# Patient Record
Sex: Female | Born: 1950 | Race: White | Hispanic: No | Marital: Married
Health system: Southern US, Community
[De-identification: ages and names within clinical notes are randomized; demographics above are authoritative.]

## PROBLEM LIST (undated history)

## (undated) DIAGNOSIS — K760 Fatty (change of) liver, not elsewhere classified: Secondary | ICD-10-CM

## (undated) DIAGNOSIS — E119 Type 2 diabetes mellitus without complications: Secondary | ICD-10-CM

## (undated) DIAGNOSIS — Z780 Asymptomatic menopausal state: Secondary | ICD-10-CM

## (undated) DIAGNOSIS — I10 Essential (primary) hypertension: Secondary | ICD-10-CM

## (undated) DIAGNOSIS — K649 Unspecified hemorrhoids: Secondary | ICD-10-CM

## (undated) DIAGNOSIS — F419 Anxiety disorder, unspecified: Secondary | ICD-10-CM

## (undated) DIAGNOSIS — E785 Hyperlipidemia, unspecified: Secondary | ICD-10-CM

## (undated) DIAGNOSIS — R7302 Impaired glucose tolerance (oral): Secondary | ICD-10-CM

## (undated) HISTORY — PX: OTHER SURGICAL HISTORY: SHX169

## (undated) HISTORY — DX: Hyperlipidemia, unspecified: E78.5

## (undated) HISTORY — DX: Unspecified hemorrhoids: K64.9

## (undated) HISTORY — DX: Impaired glucose tolerance (oral): R73.02

## (undated) HISTORY — DX: Asymptomatic menopausal state: Z78.0

## (undated) HISTORY — PX: ABDOMINAL HYSTERECTOMY: SHX81

## (undated) HISTORY — PX: TUBAL LIGATION: SHX77

## (undated) HISTORY — DX: Anxiety disorder, unspecified: F41.9

## (undated) HISTORY — DX: Fatty (change of) liver, not elsewhere classified: K76.0

## (undated) HISTORY — PX: TONSILLECTOMY: SUR1361

---

## 2006-06-18 ENCOUNTER — Ambulatory Visit: Payer: Self-pay | Admitting: Gynecology

## 2007-04-21 ENCOUNTER — Ambulatory Visit: Payer: Self-pay | Admitting: Internal Medicine

## 2007-06-24 ENCOUNTER — Ambulatory Visit: Payer: Self-pay | Admitting: Gynecology

## 2007-07-08 ENCOUNTER — Ambulatory Visit: Payer: Self-pay | Admitting: Gynecology

## 2007-07-10 ENCOUNTER — Ambulatory Visit: Payer: Self-pay | Admitting: Gynecology

## 2008-06-12 ENCOUNTER — Ambulatory Visit: Payer: Self-pay | Admitting: Gastroenterology

## 2008-07-14 ENCOUNTER — Ambulatory Visit: Payer: Self-pay | Admitting: Family Medicine

## 2009-09-01 ENCOUNTER — Ambulatory Visit: Payer: Self-pay | Admitting: General Practice

## 2010-12-20 NOTE — Assessment & Plan Note (Signed)
NAMEKERA, DEACON NO.:  [id number]   MEDICAL RECORD NO.:  [id number]          PATIENT TYPE:  POB   LOCATION:  [facility] at [facility]         FACILITY:  [facility]   PHYSICIAN:  Allie Bossier, MD        DATE OF BIRTH:  02-26-1951   DATE OF SERVICE:  06/24/2007                                  CLINIC NOTE   IDENTIFICATION:  This is a 60 year old married white gravida 1, para 43  with a 84 year old daughter and two grandchildren who comes here for her  annual exam. She has no complaints today.  She wishes to have a refill  of her  0.65 mg Premarin daily.  She has tried to wean herself off and  has been uncomfortable with menopausal symptoms.  She wishes to  continue.   PAST MEDICAL HISTORY:  Obesity.   PAST SURGICAL HISTORY:  1. TVH.  2. Tonsillectomy.  3. Nissen fundoplication.  4. Ganglion cyst removal x3.   NO KNOWN DRUG ALLERGIES.  No latex allergies.   SOCIAL HISTORY:  Is negative for tobacco, alcohol, drug use.   FAMILY HISTORY:  Is negative for breast, GYN and colon malignancies.   REVIEW OF SYSTEMS:  Her last mammogram was in 2006 and was normal.  She  has never had a colonoscopy and is not inclined to do so she has been  married for the last 35 years.   MEDICATIONS:  1. Premarin 0.625 mg daily.  2. Vitamin C daily.  3. Calcium daily.   PHYSICAL EXAM:  Weight 194, blood pressure of 132/73, height 5 feet, 7  inches.  HEENT:  Normal.  HEART:  Regular rhythm.  LUNGS:  Clear to auscultation bilaterally.  ABDOMEN:  Benign.  Breast exam normal.  EXTERNAL GENITALIA:  Mild atrophy, otherwise normal. Cuff normal.  Bimanual exam negative for masses.   ASSESSMENT/PLAN:  1. Annual exam and I have recommended a colonoscopy as well as self-      breast and self vulvar exams.  2. With regards to her mild obesity,  I have recommended checking a      TSH and fasting lipid panel, fasting glucose today.      Allie Bossier, MD     MCD/MEDQ  D:  06/24/2007   T:  06/24/2007  Job:  [id number]

## 2011-08-30 ENCOUNTER — Ambulatory Visit: Payer: Self-pay | Admitting: Family Medicine

## 2012-03-26 ENCOUNTER — Ambulatory Visit: Payer: Self-pay | Admitting: Family Medicine

## 2013-04-01 ENCOUNTER — Ambulatory Visit: Payer: Self-pay | Admitting: Family Medicine

## 2016-04-14 ENCOUNTER — Other Ambulatory Visit: Payer: Self-pay | Admitting: Family Medicine

## 2016-04-14 DIAGNOSIS — R748 Abnormal levels of other serum enzymes: Secondary | ICD-10-CM

## 2016-04-24 ENCOUNTER — Ambulatory Visit
Admission: RE | Admit: 2016-04-24 | Discharge: 2016-04-24 | Disposition: A | Discharge status: Home / Self Care | Payer: PRIVATE HEALTH INSURANCE | Source: Ambulatory Visit | Attending: Family Medicine | Admitting: Family Medicine

## 2016-04-24 DIAGNOSIS — K760 Fatty (change of) liver, not elsewhere classified: Secondary | ICD-10-CM | POA: Diagnosis not present

## 2016-04-24 DIAGNOSIS — K8020 Calculus of gallbladder without cholecystitis without obstruction: Secondary | ICD-10-CM | POA: Diagnosis not present

## 2016-04-24 DIAGNOSIS — R748 Abnormal levels of other serum enzymes: Secondary | ICD-10-CM | POA: Diagnosis present

## 2019-10-05 ENCOUNTER — Ambulatory Visit: Payer: PRIVATE HEALTH INSURANCE | Attending: Internal Medicine

## 2019-10-05 DIAGNOSIS — Z23 Encounter for immunization: Secondary | ICD-10-CM

## 2019-10-05 NOTE — Progress Notes (Signed)
   Covid-19 Vaccination Clinic  Name:  TAELYNN MCELHANNON    MRN: [id number] DOB: 12-Mar-1951  10/05/2019  Ms. Alviar was observed post Covid-19 immunization for 15 minutes without incidence. She was provided with Vaccine Information Sheet and instruction to access the V-Safe system.   Ms. Schuitema was instructed to call 911 with any severe reactions post vaccine: Marland Kitchen Difficulty breathing  . Swelling of your face and throat  . A fast heartbeat  . A bad rash all over your body  . Dizziness and weakness    Immunizations Administered    Name Date Dose VIS Date Route   Pfizer COVID-19 Vaccine 10/05/2019  8:31 AM 0.3 mL 07/18/2019 Intramuscular   Manufacturer: [employer], [employer]   Lot: [id number]   NDC: [address]-5

## 2019-10-29 ENCOUNTER — Ambulatory Visit: Payer: PRIVATE HEALTH INSURANCE | Attending: Internal Medicine

## 2019-10-29 DIAGNOSIS — Z23 Encounter for immunization: Secondary | ICD-10-CM

## 2019-10-29 NOTE — Progress Notes (Signed)
   Covid-19 Vaccination Clinic  Name:  Susan Casey    MRN: [id number] DOB: 04-17-1951  10/29/2019  [name] was observed post Covid-19 immunization for 15 minutes without incident. She was provided with Vaccine Information Sheet and instruction to access the V-Safe system.   [name] was instructed to call 911 with any severe reactions post vaccine: Marland Kitchen Difficulty breathing  . Swelling of face and throat  . A fast heartbeat  . A bad rash all over body  . Dizziness and weakness   Immunizations Administered    Name Date Dose VIS Date Route   Pfizer COVID-19 Vaccine 10/29/2019  8:43 AM 0.3 mL 07/18/2019 Intramuscular   Manufacturer: [employer], [employer]   Lot: [id number]   NDC: [address]-8

## 2020-05-20 ENCOUNTER — Other Ambulatory Visit: Payer: Self-pay | Admitting: Family Medicine

## 2020-05-20 DIAGNOSIS — Z1231 Encounter for screening mammogram for malignant neoplasm of breast: Secondary | ICD-10-CM

## 2020-06-08 ENCOUNTER — Inpatient Hospital Stay: Admission: RE | Admit: 2020-06-08 | Payer: PRIVATE HEALTH INSURANCE | Source: Ambulatory Visit

## 2020-06-18 ENCOUNTER — Other Ambulatory Visit: Payer: Self-pay

## 2020-06-18 ENCOUNTER — Ambulatory Visit
Admission: RE | Admit: 2020-06-18 | Discharge: 2020-06-18 | Disposition: A | Discharge status: Home / Self Care | Payer: Medicare Other | Source: Ambulatory Visit | Attending: Family Medicine | Admitting: Family Medicine

## 2020-06-18 DIAGNOSIS — Z1231 Encounter for screening mammogram for malignant neoplasm of breast: Secondary | ICD-10-CM | POA: Diagnosis present

## 2021-06-20 ENCOUNTER — Other Ambulatory Visit: Payer: Self-pay | Admitting: Family Medicine

## 2021-06-20 DIAGNOSIS — Z1231 Encounter for screening mammogram for malignant neoplasm of breast: Secondary | ICD-10-CM

## 2021-07-19 ENCOUNTER — Ambulatory Visit
Admission: RE | Admit: 2021-07-19 | Discharge: 2021-07-19 | Disposition: A | Discharge status: Home / Self Care | Payer: Medicare Other | Source: Ambulatory Visit | Attending: Family Medicine | Admitting: Family Medicine

## 2021-07-19 ENCOUNTER — Other Ambulatory Visit: Payer: Self-pay

## 2021-07-19 DIAGNOSIS — Z1231 Encounter for screening mammogram for malignant neoplasm of breast: Secondary | ICD-10-CM

## 2022-06-23 ENCOUNTER — Other Ambulatory Visit: Payer: Self-pay | Admitting: Family Medicine

## 2022-06-23 DIAGNOSIS — Z1231 Encounter for screening mammogram for malignant neoplasm of breast: Secondary | ICD-10-CM

## 2022-07-24 ENCOUNTER — Ambulatory Visit
Admission: RE | Admit: 2022-07-24 | Discharge: 2022-07-24 | Disposition: A | Discharge status: Home / Self Care | Payer: Medicare Other | Source: Ambulatory Visit | Attending: Family Medicine | Admitting: Family Medicine

## 2022-07-24 DIAGNOSIS — Z1231 Encounter for screening mammogram for malignant neoplasm of breast: Secondary | ICD-10-CM | POA: Diagnosis present

## 2023-05-22 ENCOUNTER — Encounter: Payer: Self-pay | Admitting: Oncology

## 2023-05-22 ENCOUNTER — Inpatient Hospital Stay: Payer: Medicare Other

## 2023-05-22 ENCOUNTER — Inpatient Hospital Stay: Payer: Medicare Other | Attending: Oncology | Admitting: Oncology

## 2023-05-22 VITALS — BP 130/93 | HR 109 | Temp 98.2°F | Resp 18 | Wt 210.1 lb

## 2023-05-22 DIAGNOSIS — D751 Secondary polycythemia: Secondary | ICD-10-CM | POA: Insufficient documentation

## 2023-05-22 DIAGNOSIS — R5382 Chronic fatigue, unspecified: Secondary | ICD-10-CM

## 2023-05-22 DIAGNOSIS — D72828 Other elevated white blood cell count: Secondary | ICD-10-CM

## 2023-05-22 DIAGNOSIS — R5383 Other fatigue: Secondary | ICD-10-CM | POA: Insufficient documentation

## 2023-05-22 DIAGNOSIS — D72820 Lymphocytosis (symptomatic): Secondary | ICD-10-CM | POA: Diagnosis not present

## 2023-05-22 DIAGNOSIS — Z114 Encounter for screening for human immunodeficiency virus [HIV]: Secondary | ICD-10-CM

## 2023-05-22 DIAGNOSIS — D72829 Elevated white blood cell count, unspecified: Secondary | ICD-10-CM | POA: Insufficient documentation

## 2023-05-22 DIAGNOSIS — R195 Other fecal abnormalities: Secondary | ICD-10-CM | POA: Diagnosis not present

## 2023-05-22 DIAGNOSIS — Z7289 Other problems related to lifestyle: Secondary | ICD-10-CM

## 2023-05-22 LAB — LACTATE DEHYDROGENASE: LDH: 171 U/L (ref 98–192)

## 2023-05-22 LAB — CBC WITH DIFFERENTIAL/PLATELET
Abs Immature Granulocytes: 0.12 10*3/uL — ABNORMAL HIGH (ref 0.00–0.07)
Basophils Absolute: 0.1 10*3/uL (ref 0.0–0.1)
Basophils Relative: 1 %
Eosinophils Absolute: 0.1 10*3/uL (ref 0.0–0.5)
Eosinophils Relative: 1 %
HCT: 47.1 % — ABNORMAL HIGH (ref 36.0–46.0)
Hemoglobin: 16.1 g/dL — ABNORMAL HIGH (ref 12.0–15.0)
Immature Granulocytes: 1 %
Lymphocytes Relative: 29 %
Lymphs Abs: 4.6 10*3/uL — ABNORMAL HIGH (ref 0.7–4.0)
MCH: 30.6 pg (ref 26.0–34.0)
MCHC: 34.2 g/dL (ref 30.0–36.0)
MCV: 89.5 fL (ref 80.0–100.0)
Monocytes Absolute: 1 10*3/uL (ref 0.1–1.0)
Monocytes Relative: 6 %
Neutro Abs: 10 10*3/uL — ABNORMAL HIGH (ref 1.7–7.7)
Neutrophils Relative %: 62 %
Platelets: 275 10*3/uL (ref 150–400)
RBC: 5.26 MIL/uL — ABNORMAL HIGH (ref 3.87–5.11)
RDW: 13.2 % (ref 11.5–15.5)
Smear Review: NORMAL
WBC: 15.8 10*3/uL — ABNORMAL HIGH (ref 4.0–10.5)
nRBC: 0 % (ref 0.0–0.2)

## 2023-05-22 LAB — COMPREHENSIVE METABOLIC PANEL
ALT: 70 U/L — ABNORMAL HIGH (ref 0–44)
AST: 47 U/L — ABNORMAL HIGH (ref 15–41)
Albumin: 4.4 g/dL (ref 3.5–5.0)
Alkaline Phosphatase: 73 U/L (ref 38–126)
Anion gap: 10 (ref 5–15)
BUN: 15 mg/dL (ref 8–23)
CO2: 21 mmol/L — ABNORMAL LOW (ref 22–32)
Calcium: 9.5 mg/dL (ref 8.9–10.3)
Chloride: 104 mmol/L (ref 98–111)
Creatinine, Ser: 0.63 mg/dL (ref 0.44–1.00)
GFR, Estimated: >60 mL/min (ref >60)
Glucose, Bld: 130 mg/dL — ABNORMAL HIGH (ref 70–99)
Potassium: 3.4 mmol/L — ABNORMAL LOW (ref 3.5–5.1)
Sodium: 135 mmol/L (ref 135–145)
Total Bilirubin: 0.4 mg/dL (ref 0.3–1.2)
Total Protein: 8.1 g/dL (ref 6.5–8.1)

## 2023-05-22 LAB — TSH: TSH: 1.555 u[IU]/mL (ref 0.350–4.500)

## 2023-05-22 NOTE — Assessment & Plan Note (Signed)
Predominately lymphocytosis.  Labs reviewed and discussed with patient that Leukocytosis, differential diagnosis is broad, acute or chronic infection and inflammation, smoking, autoimmune disease, or underlying bone marrow disorders.   For the work up of patient's leukocytosis, I recommend checking CBC; LDH, smear review, peripheral flowcytometry, hepatitis, HIV, monoclonal gammopathy workup.

## 2023-05-22 NOTE — Assessment & Plan Note (Signed)
Labs are reviewed and discussed with patient. She does not smoke Erythrocytosis is an abnormal elevation of hemoglobin and/or hematocrit in peripheral blood, and this can be caused by primary etiology, ie myeloproliferative disease, or secondary etiology, ie sleep apnea, smoking, etc  or familiar condition.  I will check erythropoietin, carbo monoxide level, JAK2 with reflex to other mutations, BCR-ABL1 FISH, epo

## 2023-05-22 NOTE — Assessment & Plan Note (Signed)
Check TSH 

## 2023-05-22 NOTE — Progress Notes (Signed)
Hematology/Oncology Consult note [phone] [phone]        REFERRING PROVIDER: Marina Goodell, MD   CHIEF COMPLAINTS/REASON FOR VISIT:  Evaluation of    ASSESSMENT & PLAN:   Leukocytosis Predominately lymphocytosis.  Labs reviewed and discussed with patient that Leukocytosis, differential diagnosis is broad, acute or chronic infection and inflammation, smoking, autoimmune disease, or underlying bone marrow disorders.   For the work up of patient's leukocytosis, I recommend checking CBC; LDH, smear review, peripheral flowcytometry, hepatitis, HIV, monoclonal gammopathy workup.    Erythrocytosis Labs are reviewed and discussed with patient. She does not smoke Erythrocytosis is an abnormal elevation of hemoglobin and/or hematocrit in peripheral blood, and this can be caused by primary etiology, ie myeloproliferative disease, or secondary etiology, ie sleep apnea, smoking, etc  or familiar condition.  I will check erythropoietin, carbo monoxide level, JAK2 with reflex to other mutations, BCR-ABL1 FISH, epo  Other fatigue Check TSH   Orders Placed This Encounter  Procedures   CBC with Differential/Platelet    Standing Status:   Future    Number of Occurrences:   1    Standing Expiration Date:   05/21/2024   Comprehensive metabolic panel    Standing Status:   Future    Number of Occurrences:   1    Standing Expiration Date:   05/21/2024   Multiple Myeloma Panel (SPEP&IFE w/QIG)    Standing Status:   Future    Number of Occurrences:   1    Standing Expiration Date:   05/21/2024   Kappa/lambda light chains    Standing Status:   Future    Number of Occurrences:   1    Standing Expiration Date:   05/21/2024   Flow cytometry panel-leukemia/lymphoma work-up    Standing Status:   Future    Number of Occurrences:   1    Standing Expiration Date:   05/21/2024   Lactate dehydrogenase    Standing Status:   Future    Number of Occurrences:   1     Standing Expiration Date:   05/21/2024   JAK2 V617F rfx CALR/MPL/E12-15    Standing Status:   Future    Number of Occurrences:   1    Standing Expiration Date:   05/21/2024   BCR-ABL1 FISH    Standing Status:   Future    Number of Occurrences:   1    Standing Expiration Date:   05/21/2024   Vitamin B12    Standing Status:   Future    Number of Occurrences:   1    Standing Expiration Date:   05/21/2024   TSH    Standing Status:   Future    Number of Occurrences:   1    Standing Expiration Date:   05/21/2024   Erythropoietin    Standing Status:   Future    Number of Occurrences:   1    Standing Expiration Date:   05/21/2024   HIV Antibody (routine testing w rflx)    Standing Status:   Future    Number of Occurrences:   1    Standing Expiration Date:   05/21/2024   Hepatitis panel, acute    Standing Status:   Future    Number of Occurrences:   1    Standing Expiration Date:   05/21/2024   Follow up in a few weeks to review results.  All questions were answered. The patient knows to call the clinic with any problems,  questions or concerns.  Rickard Patience, MD, PhD [facility] Hematology Oncology 05/22/2023   HISTORY OF PRESENTING ILLNESS:   Susan Casey is a  72 y.o.  female with PMH listed below was seen in consultation at the request of  Feldpausch, Madaline Guthrie, MD  for evaluation of leukocytosis.  02/13/2023 Cbc showed wbc 13.9, Hb 15.9, MCV 49 05/03/2023 cbc showed wbc 19.4, lymphocytosis. Hb 15.6, Hct 47 The patient reports chronic fatigue, describing it as feeling 'exhausted' all the time. Despite adequate sleep, she often requires one or two naps during the day and expresses a lack of energy. She has been taking vitamin B12 supplements (1000 mcg daily) since July when her levels were found to be low, but reports no noticeable improvement in her fatigue.  The patient also reports a disrupted sleep pattern, often waking up around three in the morning, but is able to return to  sleep. She occasionally uses melatonin or Z-Quil to aid sleep, especially when she anticipates difficulty falling asleep. Despite adequate sleep duration, she wakes up still feeling tired.  The patient denies any unintentional weight loss, instead reporting weight gain. She describes her appetite as good and expresses a desire to lose weight. She denies any history of sleep apnea.   + Hem occult positive, she has established care with GI  MEDICAL HISTORY:  Past Medical History:  Diagnosis Date   Anxiety    Fatty liver    Hemorrhoids    Hyperlipidemia    Impaired glucose tolerance    Menopause     SURGICAL HISTORY: Past Surgical History:  Procedure Laterality Date   ABDOMINAL HYSTERECTOMY     laparascopic esophagogastric nissen funcoplastly single port     TONSILLECTOMY      SOCIAL HISTORY: Social History   Socioeconomic History   Marital status: Married    Spouse name: Not on file   Number of children: Not on file   Years of education: Not on file   Highest education level: Not on file  Occupational History   Not on file  Tobacco Use   Smoking status: Never   Smokeless tobacco: Never  Vaping Use   Vaping status: Never Used  Substance and Sexual Activity   Alcohol use: Not Currently   Drug use: Never   Sexual activity: Not on file  Other Topics Concern   Not on file  Social History Narrative   Not on file   Social Determinants of Health   Financial Resource Strain: Not on file  Food Insecurity: No Food Insecurity (05/22/2023)   Hunger Vital Sign    Worried About Running Out of Food in the Last Year: Never true    Ran Out of Food in the Last Year: Never true  Transportation Needs: No Transportation Needs (05/22/2023)   PRAPARE - Administrator, Civil Service (Medical): No    Lack of Transportation (Non-Medical): No  Physical Activity: Not on file  Stress: Not on file  Social Connections: Not on file  Intimate Partner Violence: Not At Risk  (05/22/2023)   Humiliation, Afraid, Rape, and Kick questionnaire    Fear of Current or Ex-Partner: No    Emotionally Abused: No    Physically Abused: No    Sexually Abused: No    FAMILY HISTORY: Family History  Problem Relation Age of Onset   Breast cancer Neg Hx     ALLERGIES:  has no allergies on file.  MEDICATIONS:  Current Outpatient Medications  Medication Sig Dispense  Refill   Cholecalciferol (D 1000) 25 MCG (1000 UT) capsule Take 1,000 Units by mouth daily.     cyanocobalamin (VITAMIN B12) 1000 MCG tablet Take 1,000 mcg by mouth daily.     Omega-3 1000 MG CAPS Take 1 capsule by mouth 2 (two) times daily.     simvastatin (ZOCOR) 20 MG tablet Take 1 tablet by mouth at bedtime.     No current facility-administered medications for this visit.    Review of Systems  Constitutional:  Positive for fatigue. Negative for appetite change, chills and fever.  HENT:   Negative for hearing loss and voice change.   Eyes:  Negative for eye problems.  Respiratory:  Negative for chest tightness and cough.   Cardiovascular:  Negative for chest pain.  Gastrointestinal:  Negative for abdominal distention, abdominal pain and blood in stool.  Endocrine: Negative for hot flashes.  Genitourinary:  Negative for difficulty urinating and frequency.   Musculoskeletal:  Negative for arthralgias.  Skin:  Negative for itching and rash.  Neurological:  Negative for extremity weakness.  Hematological:  Negative for adenopathy.  Psychiatric/Behavioral:  Negative for confusion and sleep disturbance.    PHYSICAL EXAMINATION: ECOG PERFORMANCE STATUS: 1 - Symptomatic but completely ambulatory Vitals:   05/22/23 1447 05/22/23 1449  BP: (!) 140/89 (!) 130/93  Pulse: (!) 109   Resp: 18   Temp: 98.2 F (36.8 C)    Filed Weights   05/22/23 1447  Weight: 210 lb 1.6 oz (95.3 kg)    Physical Exam Constitutional:      General: She is not in acute distress.    Appearance: She is obese.  HENT:      Head: Normocephalic and atraumatic.  Eyes:     General: No scleral icterus. Cardiovascular:     Rate and Rhythm: Regular rhythm. Tachycardia present.     Heart sounds: Normal heart sounds.  Pulmonary:     Effort: Pulmonary effort is normal. No respiratory distress.     Breath sounds: No wheezing.  Abdominal:     General: Bowel sounds are normal. There is no distension.     Palpations: Abdomen is soft.  Musculoskeletal:        General: No deformity. Normal range of motion.     Cervical back: Normal range of motion and neck supple.  Skin:    General: Skin is warm and dry.     Findings: No erythema or rash.  Neurological:     Mental Status: She is alert and oriented to person, place, and time. Mental status is at baseline.     Cranial Nerves: No cranial nerve deficit.     Coordination: Coordination normal.  Psychiatric:        Mood and Affect: Mood normal.     LABORATORY DATA:  I have reviewed the data as listed    Latest Ref Rng & Units 05/22/2023    3:15 PM  CBC  WBC 4.0 - 10.5 K/uL 15.8   Hemoglobin 12.0 - 15.0 g/dL 45.4   Hematocrit 09.8 - 46.0 % 47.1   Platelets 150 - 400 K/uL 275       Latest Ref Rng & Units 05/22/2023    3:15 PM  CMP  Glucose 70 - 99 mg/dL 119   BUN 8 - 23 mg/dL 15   Creatinine 1.47 - 1.00 mg/dL 8.29   Sodium 562 - 130 mmol/L 135   Potassium 3.5 - 5.1 mmol/L 3.4   Chloride 98 - 111 mmol/L 104  CO2 22 - 32 mmol/L 21   Calcium 8.9 - 10.3 mg/dL 9.5   Total Protein 6.5 - 8.1 g/dL 8.1   Total Bilirubin 0.3 - 1.2 mg/dL 0.4   Alkaline Phos 38 - 126 U/L 73   AST 15 - 41 U/L 47   ALT 0 - 44 U/L 70       RADIOGRAPHIC STUDIES: I have personally reviewed the radiological images as listed and agreed with the findings in the report. No results found.

## 2023-05-23 LAB — KAPPA/LAMBDA LIGHT CHAINS
Kappa free light chain: 16.9 mg/L (ref 3.3–19.4)
Kappa, lambda light chain ratio: 1.66 — ABNORMAL HIGH (ref 0.26–1.65)
Lambda free light chains: 10.2 mg/L (ref 5.7–26.3)

## 2023-05-23 LAB — HEPATITIS PANEL, ACUTE
HCV Ab: NONREACTIVE
Hep A IgM: NONREACTIVE
Hep B C IgM: NONREACTIVE
Hepatitis B Surface Ag: NONREACTIVE

## 2023-05-23 LAB — HIV ANTIBODY (ROUTINE TESTING W REFLEX): HIV Screen 4th Generation wRfx: NONREACTIVE

## 2023-05-23 LAB — VITAMIN B12: Vitamin B-12: 760 pg/mL (ref 180–914)

## 2023-05-23 LAB — ERYTHROPOIETIN: Erythropoietin: 8.7 m[IU]/mL (ref 2.6–18.5)

## 2023-05-25 LAB — MULTIPLE MYELOMA PANEL, SERUM
Albumin SerPl Elph-Mcnc: 4 g/dL (ref 2.9–4.4)
Albumin/Glob SerPl: 1.2 (ref 0.7–1.7)
Alpha 1: 0.3 g/dL (ref 0.0–0.4)
Alpha2 Glob SerPl Elph-Mcnc: 0.9 g/dL (ref 0.4–1.0)
B-Globulin SerPl Elph-Mcnc: 1.4 g/dL — ABNORMAL HIGH (ref 0.7–1.3)
Gamma Glob SerPl Elph-Mcnc: 1.1 g/dL (ref 0.4–1.8)
Globulin, Total: 3.6 g/dL (ref 2.2–3.9)
IgA: 209 mg/dL (ref 64–422)
IgG (Immunoglobin G), Serum: 1120 mg/dL (ref 586–1602)
IgM (Immunoglobulin M), Srm: 100 mg/dL (ref 26–217)
Total Protein ELP: 7.6 g/dL (ref 6.0–8.5)

## 2023-05-25 LAB — BCR-ABL1 FISH
Cells Analyzed: 200
Cells Counted: 200

## 2023-05-28 LAB — COMP PANEL: LEUKEMIA/LYMPHOMA

## 2023-05-29 LAB — CALR +MPL + E12-E15  (REFLEX)

## 2023-05-29 LAB — JAK2 V617F RFX CALR/MPL/E12-15

## 2023-06-06 IMAGING — MG MM DIGITAL SCREENING BILAT W/ TOMO AND CAD
6 of 10 series · 6 of 30 positions shown · non-contrast
Comparison: Previous exam(s).

CLINICAL DATA: Screening.

EXAM:
DIGITAL SCREENING BILATERAL MAMMOGRAM WITH TOMOSYNTHESIS AND CAD
TECHNIQUE: Bilateral screening digital craniocaudal and mediolateral oblique
mammograms were obtained. Bilateral screening digital breast
tomosynthesis was performed. The images were evaluated with
computer-aided detection.

[R CC synth-2D]
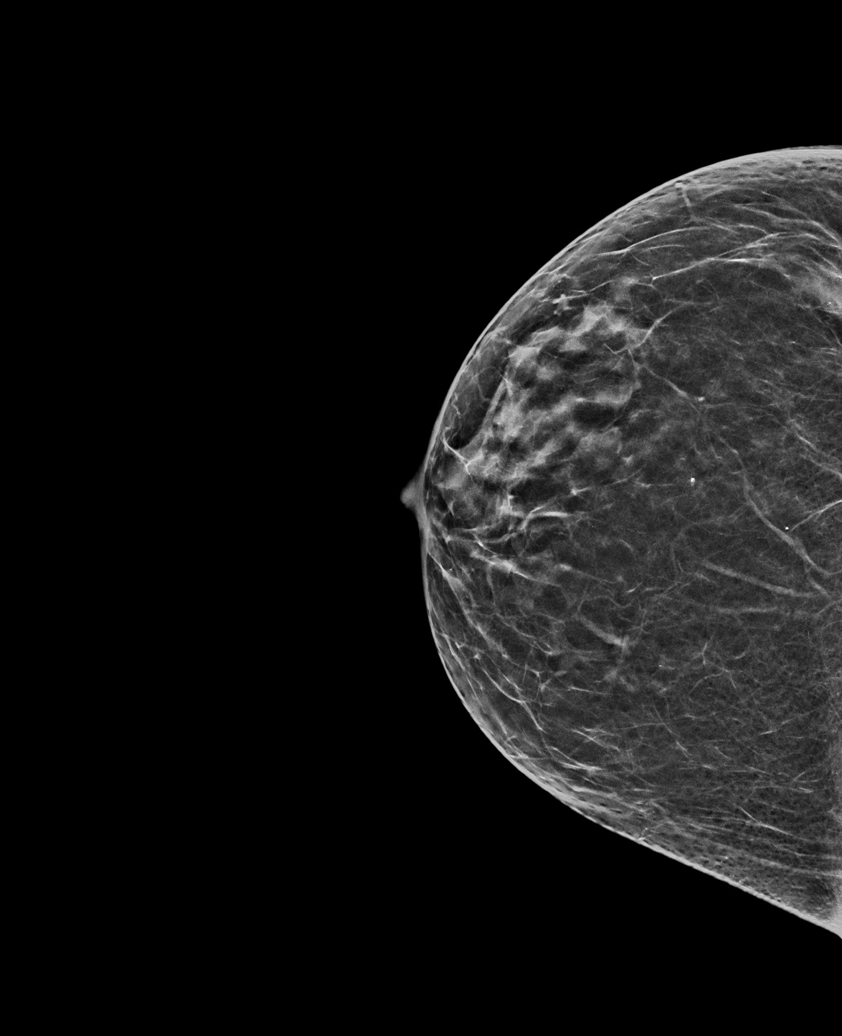

[L MLO synth-2D]
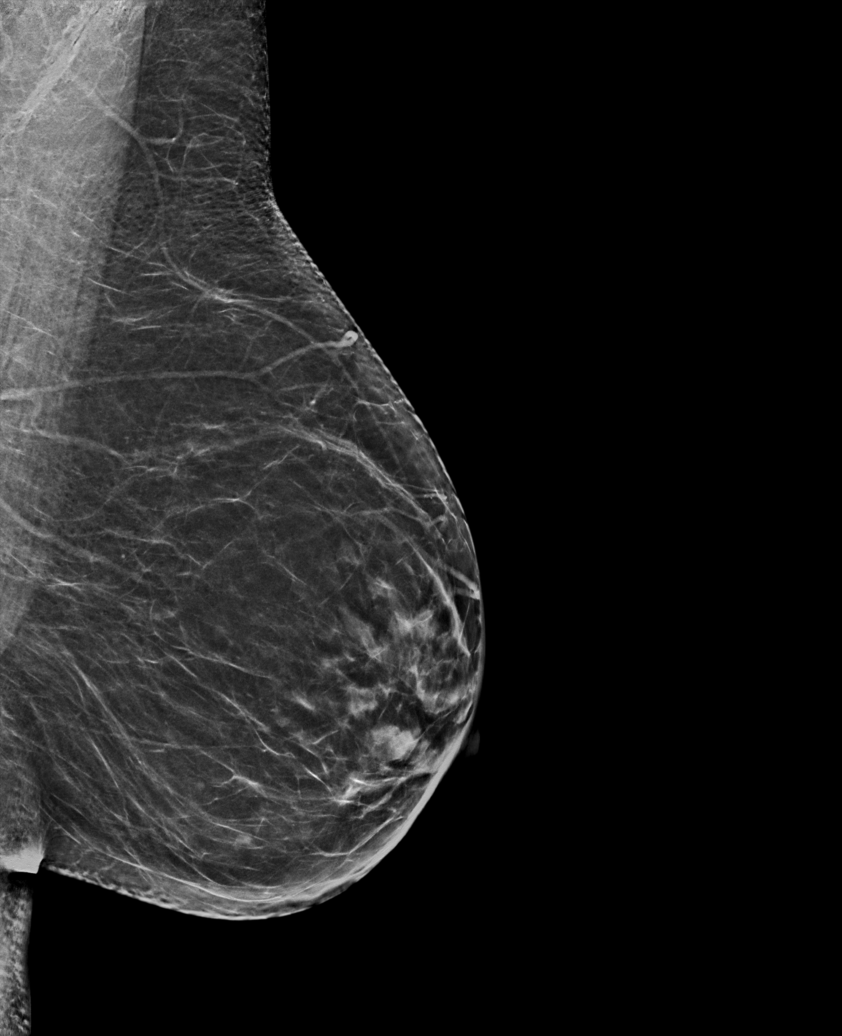

[R MLO synth-2D (1 of 2)]
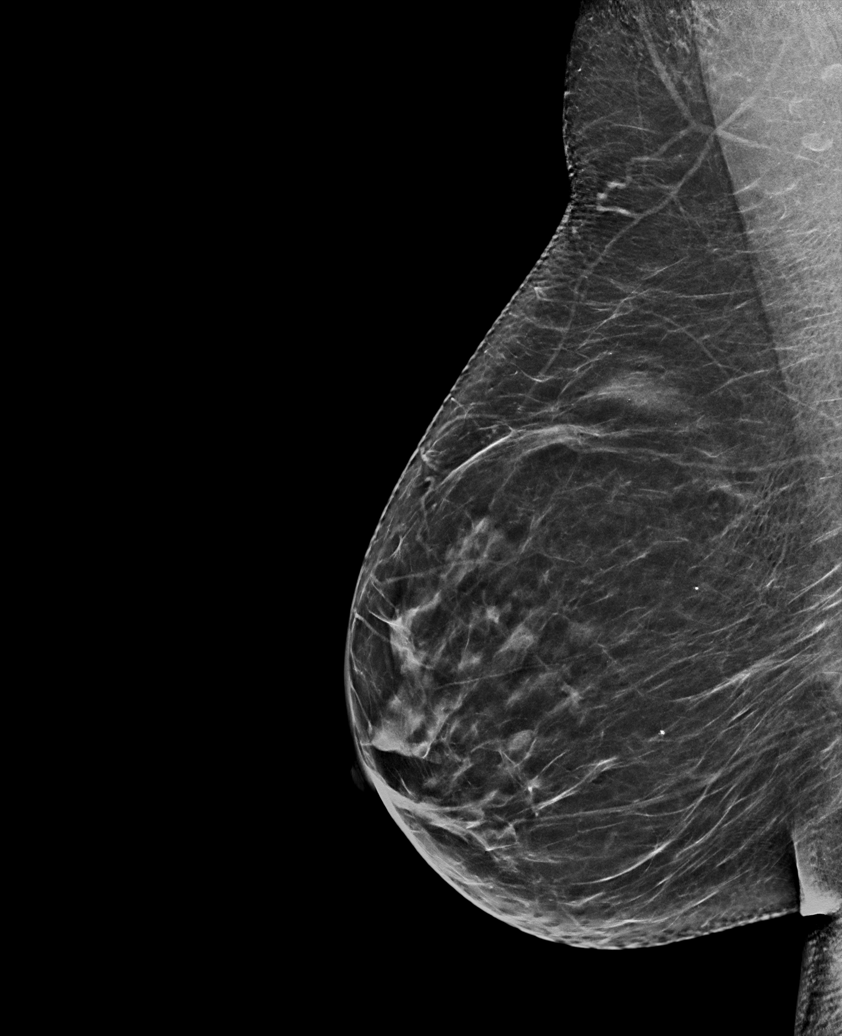

[L CC synth-2D]
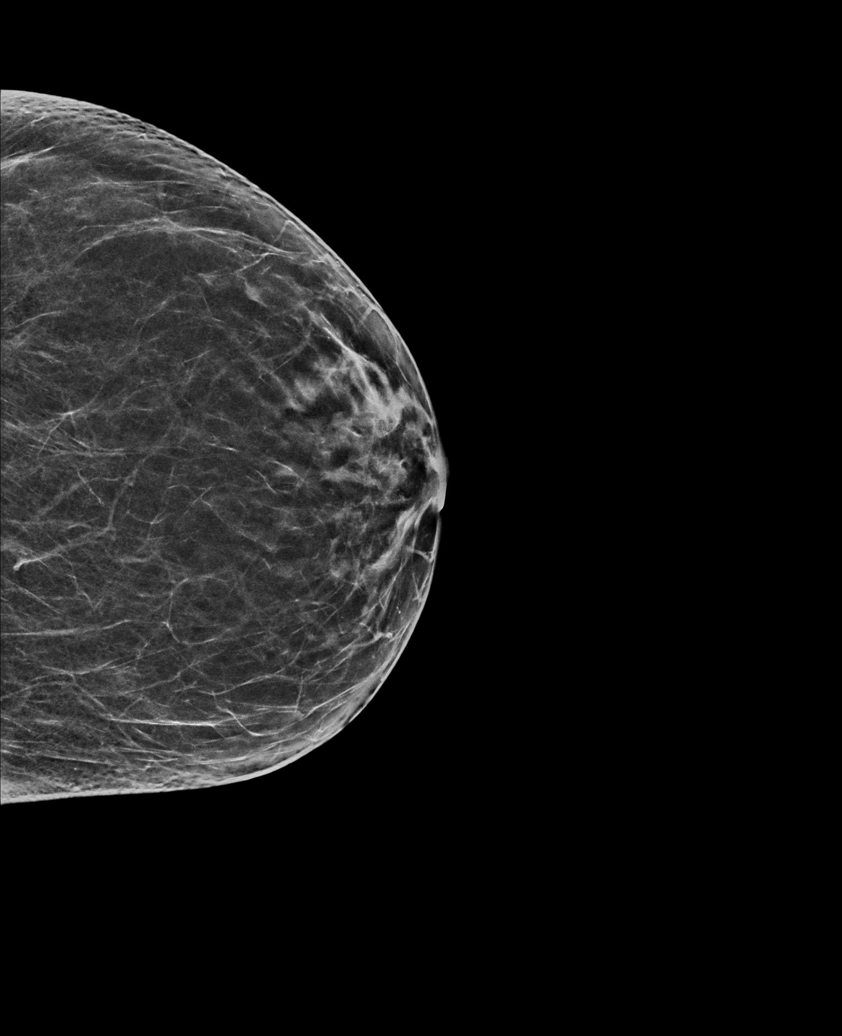

[R MLO synth-2D (2 of 2)]
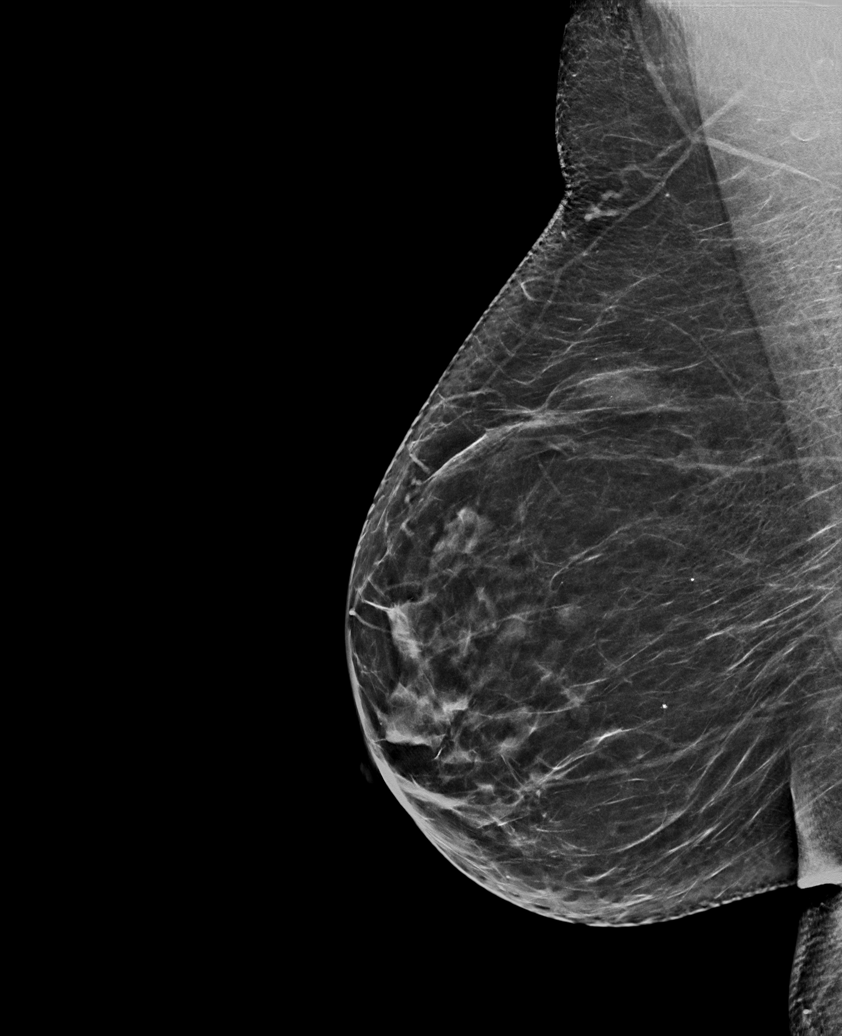

[R CC tomo · tomo slice 28/55.0]
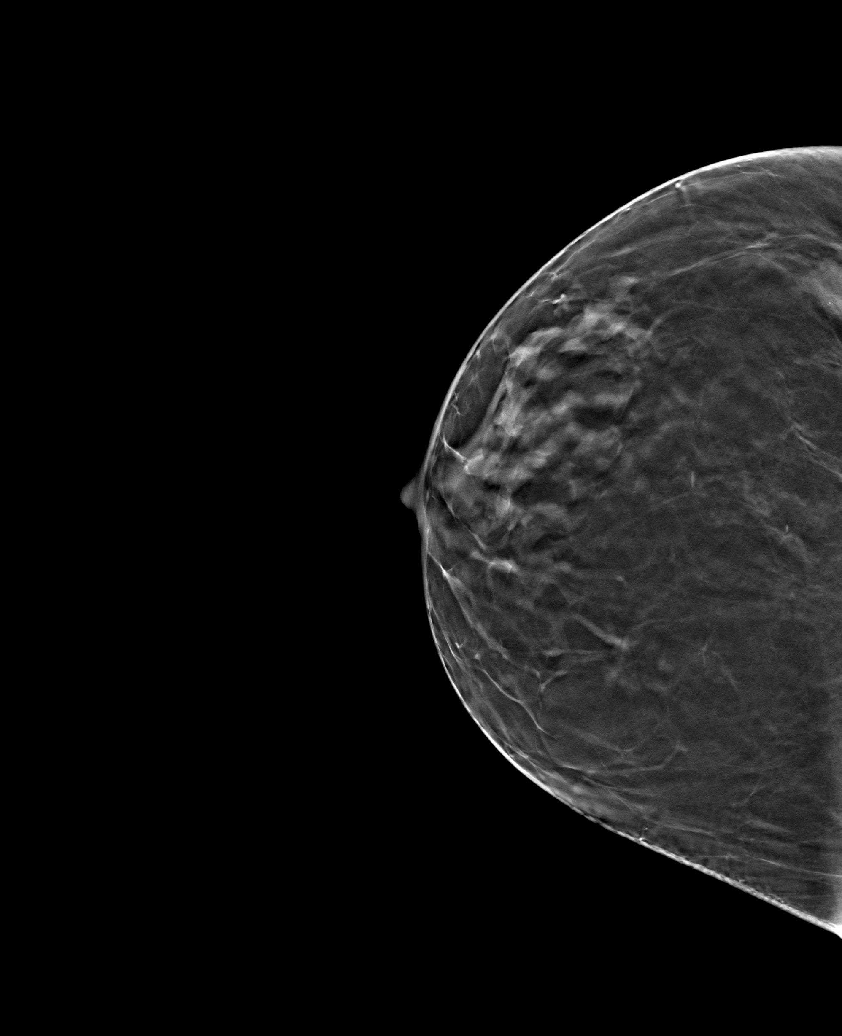

[6 of 30 positions shown; findings below may reference images not displayed]

ACR Breast Density Category b: There are scattered areas of
fibroglandular density.
FINDINGS: There are no findings suspicious for malignancy.
IMPRESSION: No mammographic evidence of malignancy. A result letter of this
screening mammogram will be mailed directly to the patient.

RECOMMENDATION:
Screening mammogram in one year. (Code:51-O-LD2)

BI-RADS CATEGORY  1: Negative.

## 2023-06-12 ENCOUNTER — Encounter: Payer: Self-pay | Admitting: Oncology

## 2023-06-12 ENCOUNTER — Inpatient Hospital Stay: Payer: Medicare Other | Attending: Oncology | Admitting: Oncology

## 2023-06-12 VITALS — BP 147/93 | HR 113 | Temp 99.4°F | Resp 16 | Ht 67.0 in | Wt 210.3 lb

## 2023-06-12 DIAGNOSIS — G479 Sleep disorder, unspecified: Secondary | ICD-10-CM | POA: Diagnosis not present

## 2023-06-12 DIAGNOSIS — D72820 Lymphocytosis (symptomatic): Secondary | ICD-10-CM | POA: Diagnosis present

## 2023-06-12 DIAGNOSIS — R7989 Other specified abnormal findings of blood chemistry: Secondary | ICD-10-CM | POA: Diagnosis not present

## 2023-06-12 DIAGNOSIS — R5383 Other fatigue: Secondary | ICD-10-CM | POA: Insufficient documentation

## 2023-06-12 DIAGNOSIS — D751 Secondary polycythemia: Secondary | ICD-10-CM | POA: Insufficient documentation

## 2023-06-12 DIAGNOSIS — K760 Fatty (change of) liver, not elsewhere classified: Secondary | ICD-10-CM | POA: Diagnosis not present

## 2023-06-12 DIAGNOSIS — D72828 Other elevated white blood cell count: Secondary | ICD-10-CM | POA: Diagnosis not present

## 2023-06-12 DIAGNOSIS — R195 Other fecal abnormalities: Secondary | ICD-10-CM | POA: Insufficient documentation

## 2023-06-12 DIAGNOSIS — R7401 Elevation of levels of liver transaminase levels: Secondary | ICD-10-CM | POA: Diagnosis not present

## 2023-06-12 NOTE — Assessment & Plan Note (Signed)
Predominately lymphocytosis.  Lab results reviewed with patient. Peripheral flow cytometry showed multi subset lymphocytosis, this is consistent with reactive process, possibly secondary to chronic inflammation. Negative hepatitis, negative HIV, no M protein.  Light chain ratio 1.66, borderline elevated.  Nonspecific.  LDH is normal.  Discussed with patient that I recommend observation.  She may further discuss with primary care provider regarding to further workup of chronic inflammation.

## 2023-06-12 NOTE — Assessment & Plan Note (Signed)
Patient has a history of fatty liver disease.  Elevated LFT could be secondary to that.   She declines ultrasound liver for evaluation.  She plans to further discuss with primary care provider. Recommend healthy diet, exercise

## 2023-06-12 NOTE — Assessment & Plan Note (Signed)
Labs are reviewed and discussed with patient. She does not smoke Normal Epo, JAK2 V617F mutation negative, with reflex to other mutations CALR, MPL, JAK 2 Ex 12-15 mutations negative. Negative BCR-ABL. Erythrocytosis likely secondary, recommend patient to further discuss with primary care provider and have sleep apnea workup

## 2023-06-12 NOTE — Progress Notes (Signed)
Hematology/Oncology Consult note [phone] [phone]        REFERRING PROVIDER: Marina Goodell, MD   CHIEF COMPLAINTS/REASON FOR VISIT:  Evaluation of leukocytosis and erythrocytosis   ASSESSMENT & PLAN:   Leukocytosis Predominately lymphocytosis.  Lab results reviewed with patient. Peripheral flow cytometry showed multi subset lymphocytosis, this is consistent with reactive process, possibly secondary to chronic inflammation. Negative hepatitis, negative HIV, no M protein.  Light chain ratio 1.66, borderline elevated.  Nonspecific.  LDH is normal.  Discussed with patient that I recommend observation.  She may further discuss with primary care provider regarding to further workup of chronic inflammation.  Erythrocytosis Labs are reviewed and discussed with patient. She does not smoke Normal Epo, JAK2 V617F mutation negative, with reflex to other mutations CALR, MPL, JAK 2 Ex 12-15 mutations negative. Negative BCR-ABL. Erythrocytosis likely secondary, recommend patient to further discuss with primary care provider and have sleep apnea workup  Elevated LFTs Patient has a history of fatty liver disease.  Elevated LFT could be secondary to that.   She declines ultrasound liver for evaluation.  She plans to further discuss with primary care provider. Recommend healthy diet, exercise   No orders of the defined types were placed in this encounter.  Follow up in a few weeks to review results.  All questions were answered. The patient knows to call the clinic with any problems, questions or concerns.  Rickard Patience, MD, PhD [facility] Hematology Oncology 06/12/2023   HISTORY OF PRESENTING ILLNESS:   Susan Casey is a  72 y.o.  female with PMH listed below was seen in consultation at the request of  Feldpausch, Madaline Guthrie, MD  for evaluation of leukocytosis.  02/13/2023 Cbc showed wbc 13.9, Hb 15.9, MCV 49 05/03/2023 cbc showed wbc 19.4, lymphocytosis. Hb  15.6, Hct 47 The patient reports chronic fatigue, describing it as feeling 'exhausted' all the time. Despite adequate sleep, she often requires one or two naps during the day and expresses a lack of energy. She has been taking vitamin B12 supplements (1000 mcg daily) since July when her levels were found to be low, but reports no noticeable improvement in her fatigue.  The patient also reports a disrupted sleep pattern, often waking up around three in the morning, but is able to return to sleep. She occasionally uses melatonin or Z-Quil to aid sleep, especially when she anticipates difficulty falling asleep. Despite adequate sleep duration, she wakes up still feeling tired.  The patient denies any unintentional weight loss, instead reporting weight gain. She describes her appetite as good and expresses a desire to lose weight. She denies any history of sleep apnea.   + Hem occult positive, she has established care with GI   INTERVAL HISTORY Susan Casey is a 72 y.o. female who has above history reviewed by me today presents for follow up visit for leukocytosis and erythrocytosis. Patient reports that she has set up to GI for workup of positive Hemoccult. She has no other new complaints.  She present to discuss results.  MEDICAL HISTORY:  Past Medical History:  Diagnosis Date   Anxiety    Fatty liver    Hemorrhoids    Hyperlipidemia    Impaired glucose tolerance    Menopause     SURGICAL HISTORY: Past Surgical History:  Procedure Laterality Date   ABDOMINAL HYSTERECTOMY     laparascopic esophagogastric nissen funcoplastly single port     TONSILLECTOMY      SOCIAL HISTORY: Social History  Socioeconomic History   Marital status: Married    Spouse name: Not on file   Number of children: Not on file   Years of education: Not on file   Highest education level: Not on file  Occupational History   Not on file  Tobacco Use   Smoking status: Never   Smokeless tobacco: Never   Vaping Use   Vaping status: Never Used  Substance and Sexual Activity   Alcohol use: Not Currently   Drug use: Never   Sexual activity: Not on file  Other Topics Concern   Not on file  Social History Narrative   Not on file   Social Determinants of Health   Financial Resource Strain: Not on file  Food Insecurity: No Food Insecurity (05/22/2023)   Hunger Vital Sign    Worried About Running Out of Food in the Last Year: Never true    Ran Out of Food in the Last Year: Never true  Transportation Needs: No Transportation Needs (05/22/2023)   PRAPARE - Administrator, Civil Service (Medical): No    Lack of Transportation (Non-Medical): No  Physical Activity: Not on file  Stress: Not on file  Social Connections: Not on file  Intimate Partner Violence: Not At Risk (05/22/2023)   Humiliation, Afraid, Rape, and Kick questionnaire    Fear of Current or Ex-Partner: No    Emotionally Abused: No    Physically Abused: No    Sexually Abused: No    FAMILY HISTORY: Family History  Problem Relation Age of Onset   Breast cancer Neg Hx     ALLERGIES:  is allergic to wound dressing adhesive.  MEDICATIONS:  Current Outpatient Medications  Medication Sig Dispense Refill   Cholecalciferol (D 1000) 25 MCG (1000 UT) capsule Take 1,000 Units by mouth daily.     cyanocobalamin (VITAMIN B12) 1000 MCG tablet Take 1,000 mcg by mouth daily.     Omega-3 1000 MG CAPS Take 1 capsule by mouth 2 (two) times daily.     simvastatin (ZOCOR) 20 MG tablet Take 1 tablet by mouth at bedtime.     No current facility-administered medications for this visit.    Review of Systems  Constitutional:  Positive for fatigue. Negative for appetite change, chills and fever.  HENT:   Negative for hearing loss and voice change.   Eyes:  Negative for eye problems.  Respiratory:  Negative for chest tightness and cough.   Cardiovascular:  Negative for chest pain.  Gastrointestinal:  Negative for abdominal  distention, abdominal pain and blood in stool.  Endocrine: Negative for hot flashes.  Genitourinary:  Negative for difficulty urinating and frequency.   Musculoskeletal:  Negative for arthralgias.  Skin:  Negative for itching and rash.  Neurological:  Negative for extremity weakness.  Hematological:  Negative for adenopathy.  Psychiatric/Behavioral:  Negative for confusion and sleep disturbance.    PHYSICAL EXAMINATION: ECOG PERFORMANCE STATUS: 1 - Symptomatic but completely ambulatory Vitals:   06/12/23 1415  BP: (!) 147/93  Pulse: (!) 113  Resp: 16  Temp: 99.4 F (37.4 C)  SpO2: 94%   Filed Weights   06/12/23 1415  Weight: 210 lb 4.8 oz (95.4 kg)    Physical Exam Constitutional:      General: She is not in acute distress.    Appearance: She is obese.  HENT:     Head: Normocephalic and atraumatic.  Eyes:     General: No scleral icterus. Cardiovascular:     Rate and Rhythm:  Tachycardia present.  Pulmonary:     Effort: Pulmonary effort is normal. No respiratory distress.  Abdominal:     General: There is no distension.  Musculoskeletal:        General: Normal range of motion.     Cervical back: Normal range of motion and neck supple.  Skin:    Findings: No erythema or rash.  Neurological:     Mental Status: She is alert and oriented to person, place, and time. Mental status is at baseline.  Psychiatric:        Mood and Affect: Mood normal.     LABORATORY DATA:  I have reviewed the data as listed    Latest Ref Rng & Units 05/22/2023    3:15 PM  CBC  WBC 4.0 - 10.5 K/uL 15.8   Hemoglobin 12.0 - 15.0 g/dL 04.5   Hematocrit 40.9 - 46.0 % 47.1   Platelets 150 - 400 K/uL 275       Latest Ref Rng & Units 05/22/2023    3:15 PM  CMP  Glucose 70 - 99 mg/dL 811   BUN 8 - 23 mg/dL 15   Creatinine 9.14 - 1.00 mg/dL 7.82   Sodium 956 - 213 mmol/L 135   Potassium 3.5 - 5.1 mmol/L 3.4   Chloride 98 - 111 mmol/L 104   CO2 22 - 32 mmol/L 21   Calcium 8.9 - 10.3  mg/dL 9.5   Total Protein 6.5 - 8.1 g/dL 8.1   Total Bilirubin 0.3 - 1.2 mg/dL 0.4   Alkaline Phos 38 - 126 U/L 73   AST 15 - 41 U/L 47   ALT 0 - 44 U/L 70       RADIOGRAPHIC STUDIES: I have personally reviewed the radiological images as listed and agreed with the findings in the report. No results found.

## 2023-07-13 ENCOUNTER — Encounter: Payer: Self-pay | Admitting: *Deleted

## 2023-08-14 ENCOUNTER — Ambulatory Visit
Admission: RE | Admit: 2023-08-14 | Discharge: 2023-08-14 | Disposition: A | Discharge status: Home / Self Care | Payer: Medicare Other | Source: Ambulatory Visit | Attending: Gastroenterology | Admitting: Gastroenterology

## 2023-08-14 ENCOUNTER — Encounter: Payer: Self-pay | Admitting: *Deleted

## 2023-08-14 ENCOUNTER — Ambulatory Visit: Payer: Medicare Other | Admitting: Anesthesiology

## 2023-08-14 ENCOUNTER — Encounter
Admission: RE | Disposition: A | Discharge status: Home / Self Care | Payer: Self-pay | Source: Ambulatory Visit | Attending: Gastroenterology

## 2023-08-14 DIAGNOSIS — E669 Obesity, unspecified: Secondary | ICD-10-CM | POA: Insufficient documentation

## 2023-08-14 DIAGNOSIS — Z87891 Personal history of nicotine dependence: Secondary | ICD-10-CM | POA: Diagnosis not present

## 2023-08-14 DIAGNOSIS — D123 Benign neoplasm of transverse colon: Secondary | ICD-10-CM | POA: Diagnosis not present

## 2023-08-14 DIAGNOSIS — D120 Benign neoplasm of cecum: Secondary | ICD-10-CM | POA: Diagnosis not present

## 2023-08-14 DIAGNOSIS — K640 First degree hemorrhoids: Secondary | ICD-10-CM | POA: Diagnosis not present

## 2023-08-14 DIAGNOSIS — Z6832 Body mass index (BMI) 32.0-32.9, adult: Secondary | ICD-10-CM | POA: Diagnosis not present

## 2023-08-14 DIAGNOSIS — D127 Benign neoplasm of rectosigmoid junction: Secondary | ICD-10-CM | POA: Diagnosis not present

## 2023-08-14 DIAGNOSIS — D122 Benign neoplasm of ascending colon: Secondary | ICD-10-CM | POA: Insufficient documentation

## 2023-08-14 DIAGNOSIS — K760 Fatty (change of) liver, not elsewhere classified: Secondary | ICD-10-CM | POA: Diagnosis not present

## 2023-08-14 DIAGNOSIS — R195 Other fecal abnormalities: Secondary | ICD-10-CM | POA: Diagnosis present

## 2023-08-14 HISTORY — DX: Type 2 diabetes mellitus without complications: E11.9

## 2023-08-14 HISTORY — PX: COLONOSCOPY WITH PROPOFOL: SHX5780

## 2023-08-14 HISTORY — DX: Essential (primary) hypertension: I10

## 2023-08-14 SURGERY — COLONOSCOPY WITH PROPOFOL
Anesthesia: General

## 2023-08-14 MED ORDER — PROPOFOL 10 MG/ML IV BOLUS
INTRAVENOUS | Status: DC | PRN
  Administered 2023-08-14: 50 mg via INTRAVENOUS
  Administered 2023-08-14: 70 mg via INTRAVENOUS
  Administered 2023-08-14: 20 mg via INTRAVENOUS
  Administered 2023-08-14: 30 mg via INTRAVENOUS

## 2023-08-14 MED ORDER — SODIUM CHLORIDE 0.9 % IV SOLN: INTRAVENOUS | Status: DC

## 2023-08-14 MED ORDER — PROPOFOL 500 MG/50ML IV EMUL
INTRAVENOUS | Status: DC | PRN
  Administered 2023-08-14: 125 ug/kg/min via INTRAVENOUS

## 2023-08-14 MED ORDER — PROPOFOL 10 MG/ML IV BOLUS
INTRAVENOUS | Status: AC
  Filled 2023-08-14: qty 20

## 2023-08-14 MED ORDER — LIDOCAINE HCL (CARDIAC) PF 100 MG/5ML IV SOSY
PREFILLED_SYRINGE | INTRAVENOUS | Status: DC | PRN
  Administered 2023-08-14: 20 mg via INTRAVENOUS

## 2023-08-14 NOTE — Transfer of Care (Signed)
 Immediate Anesthesia Transfer of Care Note  Patient: Susan Casey  Procedure(s) Performed: COLONOSCOPY WITH PROPOFOL   Patient Location: PACU  Anesthesia Type:General  Level of Consciousness: drowsy  Airway & Oxygen Therapy: Patient Spontanous Breathing  Post-op Assessment: Report given to RN and Post -op Vital signs reviewed and stable  Post vital signs: Reviewed and stable  Last Vitals:  Vitals Value Taken Time  BP 92/59 08/14/23 1042  Temp 36.1 C 08/14/23 1041  Pulse 74 08/14/23 1044  Resp 22 08/14/23 1044  SpO2 96 % 08/14/23 1044  Vitals shown include unfiled device data.  Last Pain:  Vitals:   08/14/23 1041  TempSrc: Temporal         Complications: No notable events documented.

## 2023-08-14 NOTE — Interval H&P Note (Signed)
 History and Physical Interval Note:  08/14/2023 10:07 AM  Susan Casey  has presented today for surgery, with the diagnosis of HEME(+) STOOL.  The various methods of treatment have been discussed with the patient and family. After consideration of risks, benefits and other options for treatment, the patient has consented to  Procedure(s): COLONOSCOPY WITH PROPOFOL  (N/A) as a surgical intervention.  The patient's history has been reviewed, patient examined, no change in status, stable for surgery.  I have reviewed the patient's chart and labs.  Questions were answered to the patient's satisfaction.     Ole ONEIDA Schick  Ok to proceed with colonoscopy

## 2023-08-14 NOTE — Op Note (Signed)
 [facility] Gastroenterology Patient Name: Susan Casey Procedure Date: 08/14/2023 10:07 AM MRN: [id number] Account #: [id number] Date of Birth: 1951-05-11 Admit Type: Outpatient Age: 73 Room: [facility] ENDO ROOM 1 Gender: Female Note Status: Finalized Instrument Name: Arvis [id number] Procedure:             Colonoscopy Indications:           Evaluation of unexplained GI bleeding presenting with                         fecal occult blood Providers:             Ole Schick MD, MD Referring MD:          Cheryl CHARLENA Jericho (Referring MD) Medicines:             Monitored Anesthesia Care Complications:         No immediate complications. Estimated blood loss:                         Minimal. Procedure:             Pre-Anesthesia Assessment:                        - Prior to the procedure, a History and Physical was                         performed, and patient medications and allergies were                         reviewed. The patient is competent. The risks and                         benefits of the procedure and the sedation options and                         risks were discussed with the patient. All questions                         were answered and informed consent was obtained.                         Patient identification and proposed procedure were                         verified by the physician, the nurse, the                         anesthesiologist, the anesthetist and the technician                         in the endoscopy suite. Mental Status Examination:                         alert and oriented. Airway Examination: normal                         oropharyngeal airway and neck mobility. Respiratory  Examination: clear to auscultation. CV Examination:                         normal. Prophylactic Antibiotics: The patient does not                         require prophylactic antibiotics. Prior                          Anticoagulants: The patient has taken no anticoagulant                         or antiplatelet agents. ASA Grade Assessment: II - A                         patient with mild systemic disease. After reviewing                         the risks and benefits, the patient was deemed in                         satisfactory condition to undergo the procedure. The                         anesthesia plan was to use monitored anesthesia care                         (MAC). Immediately prior to administration of                         medications, the patient was re-assessed for adequacy                         to receive sedatives. The heart rate, respiratory                         rate, oxygen saturations, blood pressure, adequacy of                         pulmonary ventilation, and response to care were                         monitored throughout the procedure. The physical                         status of the patient was re-assessed after the                         procedure.                        After obtaining informed consent, the colonoscope was                         passed under direct vision. Throughout the procedure,                         the patient's blood pressure, pulse, and oxygen  saturations were monitored continuously. The                         Colonoscope was introduced through the anus and                         advanced to the the terminal ileum, with                         identification of the appendiceal orifice and IC                         valve. The colonoscopy was performed without                         difficulty. The patient tolerated the procedure well.                         The quality of the bowel preparation was good. The                         terminal ileum, ileocecal valve, appendiceal orifice,                         and rectum were photographed. Findings:      The perianal and digital rectal examinations were  normal.      The terminal ileum appeared normal.      A 15 mm polyp was found in the ileocecal valve. The polyp was sessile.       The polyp was removed with a piecemeal technique using a cold snare.       Resection and retrieval were complete. Estimated blood loss was minimal.      A 2 mm polyp was found in the ascending colon. The polyp was sessile.       The polyp was removed with a cold snare. Resection and retrieval were       complete. Estimated blood loss was minimal.      A 2 mm polyp was found in the proximal transverse colon. The polyp was       sessile. The polyp was removed with a jumbo cold forceps. Resection and       retrieval were complete. Estimated blood loss was minimal.      A 4 mm polyp was found in the rectum. The polyp was sessile. The polyp       was removed with a cold snare. Resection and retrieval were complete.       Estimated blood loss was minimal.      Internal hemorrhoids were found during retroflexion. The hemorrhoids       were Grade I (internal hemorrhoids that do not prolapse).      The exam was otherwise without abnormality on direct and retroflexion       views. Impression:            - The examined portion of the ileum was normal.                        - One 15 mm polyp at the ileocecal valve, removed  piecemeal using a cold snare. Resected and retrieved.                        - One 2 mm polyp in the ascending colon, removed with                         a cold snare. Resected and retrieved.                        - One 2 mm polyp in the proximal transverse colon,                         removed with a jumbo cold forceps. Resected and                         retrieved.                        - One 4 mm polyp in the rectum, removed with a cold                         snare. Resected and retrieved.                        - Internal hemorrhoids.                        - The examination was otherwise normal on direct and                          retroflexion views. Recommendation:        - Discharge patient to home.                        - Resume previous diet.                        - Continue present medications.                        - Await pathology results.                        - Repeat colonoscopy in 6 months for surveillance                         after piecemeal polypectomy.                        - Return to referring physician as previously                         scheduled. Procedure Code(s):     --- Professional ---                        [phone], Colonoscopy, flexible; with removal of                         tumor(s), polyp(s), or other lesion(s) by snare  technique                        [id number], 59, Colonoscopy, flexible; with biopsy, single                         or multiple Diagnosis Code(s):     --- Professional ---                        K64.0, First degree hemorrhoids                        D12.0, Benign neoplasm of cecum                        D12.2, Benign neoplasm of ascending colon                        D12.3, Benign neoplasm of transverse colon (hepatic                         flexure or splenic flexure)                        D12.8, Benign neoplasm of rectum                        R19.5, Other fecal abnormalities CPT copyright 2022 American Medical Association. All rights reserved. The codes documented in this report are preliminary and upon coder review may  be revised to meet current compliance requirements. Ole Schick MD, MD 08/14/2023 10:46:24 AM Number of Addenda: 0 Note Initiated On: 08/14/2023 10:07 AM Scope Withdrawal Time: 0 hours 13 minutes 55 seconds  Total Procedure Duration: 0 hours 21 minutes 37 seconds  Estimated Blood Loss:  Estimated blood loss was minimal.      [facility]

## 2023-08-14 NOTE — H&P (Signed)
 Outpatient short stay form Pre-procedure 08/14/2023  Ole ONEIDA Schick, MD  Primary Physician: Jeffie Cheryl BRAVO, MD  Reason for visit:  FOBT positive  History of present illness:    73 y/o lady with history of HLD, obesity, and anxiety here for colonoscopy due to FOBT positive test. Last colonoscopy in 2009 was unremarkable. No blood thinners. No family history of GI malignancies. History of nissen fundoplication and hysterectomy.    Current Facility-Administered Medications:    0.9 %  sodium chloride  infusion, , Intravenous, Continuous, Miia Blanks, Ole ONEIDA, MD, Last Rate: 20 mL/hr at 08/14/23 0933, New Bag at 08/14/23 0933  Medications Prior to Admission  Medication Sig Dispense Refill Last Dose/Taking   Cholecalciferol (D 1000) 25 MCG (1000 UT) capsule Take 1,000 Units by mouth daily.   Past Week   cyanocobalamin  (VITAMIN B12) 1000 MCG tablet Take 1,000 mcg by mouth daily.   Past Week   Omega-3 1000 MG CAPS Take 1 capsule by mouth 2 (two) times daily.   Past Week   simvastatin (ZOCOR) 20 MG tablet Take 1 tablet by mouth at bedtime.   Past Week     Allergies  Allergen Reactions   Wound Dressing Adhesive Rash     Past Medical History:  Diagnosis Date   Anxiety    Fatty liver    Hemorrhoids    Hyperlipidemia    Impaired glucose tolerance    Menopause     Review of systems:  Otherwise negative.    Physical Exam  Gen: Alert, oriented. Appears stated age.  HEENT: PERRLA. Lungs: No respiratory distress CV: RRR Abd: soft, benign, no masses Ext: No edema    Planned procedures: Proceed with colonoscopy. The patient understands the nature of the planned procedure, indications, risks, alternatives and potential complications including but not limited to bleeding, infection, perforation, damage to internal organs and possible oversedation/side effects from anesthesia. The patient agrees and gives consent to proceed.  Please refer to procedure notes for findings,  recommendations and patient disposition/instructions.     Ole ONEIDA Schick, MD [facility] Gastroenterology

## 2023-08-14 NOTE — Anesthesia Preprocedure Evaluation (Addendum)
 Anesthesia Evaluation  Patient identified by MRN, date of birth, ID band Patient awake    Reviewed: Allergy & Precautions, NPO status , Patient's Chart, lab work & pertinent test results  History of Anesthesia Complications Negative for: history of anesthetic complications  Airway Mallampati: IV   Neck ROM: Full    Dental no notable dental hx.    Pulmonary former smoker (quit greater than 10 years ago)   Pulmonary exam normal breath sounds clear to auscultation       Cardiovascular Exercise Tolerance: Good negative cardio ROS Normal cardiovascular exam Rhythm:Regular Rate:Normal     Neuro/Psych  PSYCHIATRIC DISORDERS Anxiety     negative neurological ROS     GI/Hepatic Fatty liver S/p Nissen   Endo/Other  Obesity   Renal/GU negative Renal ROS     Musculoskeletal   Abdominal   Peds  Hematology negative hematology ROS (+)   Anesthesia Other Findings   Reproductive/Obstetrics                             Anesthesia Physical Anesthesia Plan  ASA: 2  Anesthesia Plan: General   Post-op Pain Management:    Induction: Intravenous  PONV Risk Score and Plan: 3 and Propofol  infusion, TIVA and Treatment may vary due to age or medical condition  Airway Management Planned: Natural Airway  Additional Equipment:   Intra-op Plan:   Post-operative Plan:   Informed Consent: I have reviewed the patients History and Physical, chart, labs and discussed the procedure including the risks, benefits and alternatives for the proposed anesthesia with the patient or authorized representative who has indicated his/her understanding and acceptance.       Plan Discussed with: CRNA  Anesthesia Plan Comments: (LMA/GETA backup discussed.  Patient consented for risks of anesthesia including but not limited to:  - adverse reactions to medications - damage to eyes, teeth, lips or other oral mucosa -  nerve damage due to positioning  - sore throat or hoarseness - damage to heart, brain, nerves, lungs, other parts of body or loss of life  Informed patient about role of CRNA in peri- and intra-operative care.  Patient voiced understanding.)        Anesthesia Quick Evaluation

## 2023-08-14 NOTE — Anesthesia Postprocedure Evaluation (Signed)
 Anesthesia Post Note  Patient: Susan Casey  Procedure(s) Performed: COLONOSCOPY WITH PROPOFOL   Patient location during evaluation: PACU Anesthesia Type: General Level of consciousness: awake and alert, oriented and patient cooperative Pain management: pain level controlled Vital Signs Assessment: post-procedure vital signs reviewed and stable Respiratory status: spontaneous breathing, nonlabored ventilation and respiratory function stable Cardiovascular status: blood pressure returned to baseline and stable Postop Assessment: adequate PO intake Anesthetic complications: no   No notable events documented.   Last Vitals:  Vitals:   08/14/23 1041 08/14/23 1101  BP: (!) 92/59 105/67  Pulse: 72   Resp:    Temp: (!) 36.1 C   SpO2: 97%     Last Pain:  Vitals:   08/14/23 1101  TempSrc:   PainSc: 0-No pain                 Alfonso Ruths

## 2023-08-15 LAB — SURGICAL PATHOLOGY

## 2024-01-29 ENCOUNTER — Encounter: Payer: Self-pay | Admitting: *Deleted

## 2024-02-19 ENCOUNTER — Other Ambulatory Visit: Payer: Self-pay

## 2024-02-19 ENCOUNTER — Ambulatory Visit: Admitting: Anesthesiology

## 2024-02-19 ENCOUNTER — Ambulatory Visit
Admission: RE | Admit: 2024-02-19 | Discharge: 2024-02-19 | Disposition: A | Discharge status: Home / Self Care | Payer: Medicare Other | Attending: Gastroenterology | Admitting: Gastroenterology

## 2024-02-19 ENCOUNTER — Encounter
Admission: RE | Disposition: A | Discharge status: Home / Self Care | Payer: Self-pay | Source: Home / Self Care | Attending: Gastroenterology

## 2024-02-19 DIAGNOSIS — K635 Polyp of colon: Secondary | ICD-10-CM | POA: Diagnosis not present

## 2024-02-19 DIAGNOSIS — Z09 Encounter for follow-up examination after completed treatment for conditions other than malignant neoplasm: Secondary | ICD-10-CM | POA: Diagnosis present

## 2024-02-19 DIAGNOSIS — Z6832 Body mass index (BMI) 32.0-32.9, adult: Secondary | ICD-10-CM | POA: Diagnosis not present

## 2024-02-19 DIAGNOSIS — E669 Obesity, unspecified: Secondary | ICD-10-CM | POA: Diagnosis not present

## 2024-02-19 DIAGNOSIS — E785 Hyperlipidemia, unspecified: Secondary | ICD-10-CM | POA: Insufficient documentation

## 2024-02-19 DIAGNOSIS — D122 Benign neoplasm of ascending colon: Secondary | ICD-10-CM | POA: Insufficient documentation

## 2024-02-19 DIAGNOSIS — K640 First degree hemorrhoids: Secondary | ICD-10-CM | POA: Insufficient documentation

## 2024-02-19 DIAGNOSIS — Z87891 Personal history of nicotine dependence: Secondary | ICD-10-CM | POA: Diagnosis not present

## 2024-02-19 HISTORY — PX: COLONOSCOPY WITH PROPOFOL: SHX5780

## 2024-02-19 HISTORY — PX: POLYPECTOMY: SHX149

## 2024-02-19 SURGERY — COLONOSCOPY WITH PROPOFOL
Anesthesia: General

## 2024-02-19 MED ORDER — EPHEDRINE SULFATE-NACL 50-0.9 MG/10ML-% IV SOSY
PREFILLED_SYRINGE | INTRAVENOUS | Status: DC | PRN
  Administered 2024-02-19: 5 mg via INTRAVENOUS

## 2024-02-19 MED ORDER — PHENYLEPHRINE 80 MCG/ML (10ML) SYRINGE FOR IV PUSH (FOR BLOOD PRESSURE SUPPORT)
PREFILLED_SYRINGE | INTRAVENOUS | Status: AC
  Filled 2024-02-19: qty 10

## 2024-02-19 MED ORDER — PROPOFOL 10 MG/ML IV BOLUS
INTRAVENOUS | Status: DC | PRN
  Administered 2024-02-19: 40 mg via INTRAVENOUS
  Administered 2024-02-19: 20 mg via INTRAVENOUS

## 2024-02-19 MED ORDER — LIDOCAINE HCL (CARDIAC) PF 100 MG/5ML IV SOSY
PREFILLED_SYRINGE | INTRAVENOUS | Status: DC | PRN
  Administered 2024-02-19: 80 mg via INTRAVENOUS

## 2024-02-19 MED ORDER — EPHEDRINE 5 MG/ML INJ
INTRAVENOUS | Status: AC
  Filled 2024-02-19: qty 5

## 2024-02-19 MED ORDER — DEXMEDETOMIDINE HCL IN NACL 80 MCG/20ML IV SOLN
INTRAVENOUS | Status: DC | PRN
  Administered 2024-02-19: 8 ug via INTRAVENOUS
  Administered 2024-02-19: 12 ug via INTRAVENOUS

## 2024-02-19 MED ORDER — SODIUM CHLORIDE 0.9 % IV SOLN: INTRAVENOUS | Status: DC

## 2024-02-19 MED ORDER — SODIUM CHLORIDE (PF) 0.9 % IJ SOLN
INTRAMUSCULAR | Status: DC | PRN
  Administered 2024-02-19: 2 mL via INTRAVENOUS

## 2024-02-19 MED ORDER — LIDOCAINE HCL (PF) 2 % IJ SOLN
INTRAMUSCULAR | Status: AC
  Filled 2024-02-19: qty 5

## 2024-02-19 MED ORDER — EPHEDRINE 5 MG/ML INJ
INTRAVENOUS | Status: AC
Start: 2024-02-19 — End: 2024-02-19
  Filled 2024-02-19: qty 5

## 2024-02-19 MED ORDER — EPHEDRINE 5 MG/ML INJ
INTRAVENOUS | Status: AC
  Filled 2024-02-19: qty 10

## 2024-02-19 MED ORDER — PROPOFOL 500 MG/50ML IV EMUL
INTRAVENOUS | Status: DC | PRN
  Administered 2024-02-19: 75 ug/kg/min via INTRAVENOUS

## 2024-02-19 NOTE — Anesthesia Preprocedure Evaluation (Signed)
 Anesthesia Evaluation  Patient identified by MRN, date of birth, ID band Patient awake    Reviewed: Allergy & Precautions, NPO status , Patient's Chart, lab work & pertinent test results  History of Anesthesia Complications Negative for: history of anesthetic complications  Airway Mallampati: III  TM Distance: >3 FB Neck ROM: full    Dental no notable dental hx.    Pulmonary neg pulmonary ROS, former smoker   Pulmonary exam normal        Cardiovascular negative cardio ROS Normal cardiovascular exam     Neuro/Psych  PSYCHIATRIC DISORDERS Anxiety     negative neurological ROS     GI/Hepatic negative GI ROS, Neg liver ROS,,,  Endo/Other  negative endocrine ROS    Renal/GU negative Renal ROS  negative genitourinary   Musculoskeletal   Abdominal   Peds  Hematology negative hematology ROS (+)   Anesthesia Other Findings Past Medical History: No date: Anxiety No date: Fatty liver No date: Hemorrhoids No date: Hyperlipidemia No date: Impaired glucose tolerance No date: Menopause  Past Surgical History: No date: ABDOMINAL HYSTERECTOMY 08/14/2023: COLONOSCOPY WITH PROPOFOL ; N/A     Comment:  Procedure: COLONOSCOPY WITH PROPOFOL ;  Surgeon:               Maryruth Ole DASEN, MD;  Location: [facility] ENDOSCOPY;                Service: Endoscopy;  Laterality: N/A; No date: laparascopic esophagogastric nissen funcoplastly single port No date: TONSILLECTOMY No date: TUBAL LIGATION     Reproductive/Obstetrics negative OB ROS                              Anesthesia Physical Anesthesia Plan  ASA: 2  Anesthesia Plan: General   Post-op Pain Management: Minimal or no pain anticipated   Induction: Intravenous  PONV Risk Score and Plan: 2 and Propofol  infusion and TIVA  Airway Management Planned: Natural Airway and Nasal Cannula  Additional Equipment:   Intra-op Plan:   Post-operative  Plan:   Informed Consent: I have reviewed the patients History and Physical, chart, labs and discussed the procedure including the risks, benefits and alternatives for the proposed anesthesia with the patient or authorized representative who has indicated his/her understanding and acceptance.     Dental Advisory Given  Plan Discussed with: Anesthesiologist, CRNA and Surgeon  Anesthesia Plan Comments: (Patient consented for risks of anesthesia including but not limited to:  - adverse reactions to medications - risk of airway placement if required - damage to eyes, teeth, lips or other oral mucosa - nerve damage due to positioning  - sore throat or hoarseness - Damage to heart, brain, nerves, lungs, other parts of body or loss of life  Patient voiced understanding and assent.)         Anesthesia Quick Evaluation

## 2024-02-19 NOTE — H&P (Signed)
 Outpatient short stay form Pre-procedure 02/19/2024  Susan ONEIDA Schick, MD  Primary Physician: Jeffie Cheryl BRAVO, MD  Reason for visit:  Piecemeal polypectomy  History of present illness:    73 y/o lady with history of HLD, obesity, and anxiety here for colonoscopy due to piecemeal polypectomy. Last colonoscopy was in January with piecemeal removal of ICV polyp. No blood thinners. No family history of GI malignancies. History of nissen fundoplication and hysterectomy.     Current Facility-Administered Medications:    0.9 %  sodium chloride  infusion, , Intravenous, Continuous, L[place]on Kidney, Susan ONEIDA, MD, Last Rate: 20 mL/hr at 02/19/24 0824, Continued from Pre-op at 02/19/24 0824  Medications Prior to Admission  Medication Sig Dispense Refill Last Dose/Taking   Cholecalciferol (D 1000) 25 MCG (1000 UT) capsule Take 1,000 Units by mouth daily.   Past Week   cyanocobalamin  (VITAMIN B12) 1000 MCG tablet Take 1,000 mcg by mouth daily.   Past Week   Omega-3 1000 MG CAPS Take 1 capsule by mouth 2 (two) times daily.   Past Week   simvastatin (ZOCOR) 20 MG tablet Take 1 tablet by mouth at bedtime.   Past Week     Allergies  Allergen Reactions   Wound Dressing Adhesive Rash     Past Medical History:  Diagnosis Date   Anxiety    Fatty liver    Hemorrhoids    Hyperlipidemia    Impaired glucose tolerance    Menopause     Review of systems:  Otherwise negative.    Physical Exam  Gen: Alert, oriented. Appears stated age.  HEENT: PERRLA. Lungs: No respiratory distress CV: RRR Abd: soft, benign, no masses Ext: No edema    Planned procedures: Proceed with colonoscopy. The patient understands the nature of the planned procedure, indications, risks, alternatives and potential complications including but not limited to bleeding, infection, perforation, damage to internal organs and possible oversedation/side effects from anesthesia. The patient agrees and gives consent to proceed.   Please refer to procedure notes for findings, recommendations and patient disposition/instructions.     Susan ONEIDA Schick, MD [facility]

## 2024-02-19 NOTE — Transfer of Care (Signed)
 Immediate Anesthesia Transfer of Care Note  Patient: Susan Casey  Procedure(s) Performed: COLONOSCOPY WITH PROPOFOL  POLYPECTOMY, INTESTINE  Patient Location: PACU  Anesthesia Type:General  Level of Consciousness: sedated  Airway & Oxygen Therapy: Patient Spontanous Breathing  Post-op Assessment: Report given to RN and Post -op Vital signs reviewed and stable  Post vital signs: Reviewed and stable  Last Vitals:  Vitals Value Taken Time  BP 103/67 02/19/24 09:35  Temp 35.1 C 02/19/24 09:30  Pulse 74 02/19/24 09:37  Resp 19 02/19/24 09:37  SpO2 93 % 02/19/24 09:37  Vitals shown include unfiled device data.  Last Pain:  Vitals:   02/19/24 0930  TempSrc: Temporal  PainSc:          Complications: No notable events documented.

## 2024-02-19 NOTE — Interval H&P Note (Signed)
 History and Physical Interval Note:  02/19/2024 9:03 AM  [name]  has presented today for surgery, with the diagnosis of adenomatous polyp.  The various methods of treatment have been discussed with the patient and family. After consideration of risks, benefits and other options for treatment, the patient has consented to  Procedure(s): COLONOSCOPY WITH PROPOFOL  (N/A) as a surgical intervention.  The patient's history has been reviewed, patient examined, no change in status, stable for surgery.  I have reviewed the patient's chart and labs.  Questions were answered to the patient's satisfaction.     Ole ONEIDA Schick  Ok to proceed with colonoscopy

## 2024-02-19 NOTE — Op Note (Signed)
 [facility] Patient Name: Susan Casey Procedure Date: 02/19/2024 8:58 AM MRN: [id number] Account #: [id number] Date of Birth: [birth year] Admit Type: Outpatient Age: 73 Room: [facility] ENDO ROOM 1 Gender: Female Note Status: Finalized Instrument Name: Arvis [id number] Procedure:             Colonoscopy Indications:           Surveillance: Personal history of piecemeal removal of                         adenoma on last colonoscopy (less than 1 year ago) Providers:             Ole Schick MD, MD Medicines:             Monitored Anesthesia Care Complications:         No immediate complications. Estimated blood loss:                         Minimal. Procedure:             Pre-Anesthesia Assessment:                        - Prior to the procedure, a History and Physical was                         performed, and patient medications and allergies were                         reviewed. The patient is competent. The risks and                         benefits of the procedure and the sedation options and                         risks were discussed with the patient. All questions                         were answered and informed consent was obtained.                         Patient identification and proposed procedure were                         verified by the physician, the nurse, the                         anesthesiologist, the anesthetist and the technician                         in the endoscopy suite. Mental Status Examination:                         alert and oriented. Airway Examination: normal                         oropharyngeal airway and neck mobility. Respiratory                         Examination: clear to auscultation. CV Examination:  normal. Prophylactic Antibiotics: The patient does not                         require prophylactic antibiotics. Prior                         Anticoagulants: The patient  has taken no anticoagulant                         or antiplatelet agents. ASA Grade Assessment: II - A                         patient with mild systemic disease. After reviewing                         the risks and benefits, the patient was deemed in                         satisfactory condition to undergo the procedure. The                         anesthesia plan was to use monitored anesthesia care                         (MAC). Immediately prior to administration of                         medications, the patient was re-assessed for adequacy                         to receive sedatives. The heart rate, respiratory                         rate, oxygen saturations, blood pressure, adequacy of                         pulmonary ventilation, and response to care were                         monitored throughout the procedure. The physical                         status of the patient was re-assessed after the                         procedure.                        After obtaining informed consent, the colonoscope was                         passed under direct vision. Throughout the procedure,                         the patient's blood pressure, pulse, and oxygen                         saturations were monitored continuously. The  Colonoscope was introduced through the anus and                         advanced to the the cecum, identified by appendiceal                         orifice and ileocecal valve. The colonoscopy was                         performed without difficulty. The patient tolerated                         the procedure well. The quality of the bowel                         preparation was good. The ileocecal valve, appendiceal                         orifice, and rectum were photographed. Findings:      The perianal and digital rectal examinations were normal.      A 4 mm polyp was found in the ileocecal valve. The polyp was flat. This        is a small amount of residual polyp from previous polypectomy. Area was       successfully injected with 2 mL saline for a lift polypectomy. The polyp       was removed with a cold snare. Resection and retrieval were complete.       Estimated blood loss was minimal.      A 2 mm polyp was found in the cecum. The polyp was sessile. The polyp       was removed with a cold snare. Resection and retrieval were complete.       Estimated blood loss was minimal.      A 3 mm polyp was found in the ascending colon. The polyp was sessile.       The polyp was removed with a cold snare. Resection and retrieval were       complete. Estimated blood loss was minimal.      Internal hemorrhoids were found during retroflexion. The hemorrhoids       were Grade I (internal hemorrhoids that do not prolapse).      The exam was otherwise without abnormality on direct and retroflexion       views. Impression:            - One 4 mm polyp at the ileocecal valve, removed with                         a cold snare. Resected and retrieved. Injected.                        - One 2 mm polyp in the cecum, removed with a cold                         snare. Resected and retrieved.                        - One 3 mm polyp in the ascending colon, removed with  a cold snare. Resected and retrieved.                        - Internal hemorrhoids.                        - The examination was otherwise normal on direct and                         retroflexion views. Recommendation:        - Discharge patient to home.                        - Resume previous diet.                        - Continue present medications.                        - Await pathology results.                        - Repeat colonoscopy in 3 years for surveillance.                        - Return to referring physician as previously                         scheduled. Procedure Code(s):     --- Professional ---                         [phone], Colonoscopy, flexible; with removal of                         tumor(s), polyp(s), or other lesion(s) by snare                         technique                        45381, Colonoscopy, flexible; with directed submucosal                         injection(s), any substance Diagnosis Code(s):     --- Professional ---                        D12.0, Benign neoplasm of cecum                        D12.2, Benign neoplasm of ascending colon                        K64.0, First degree hemorrhoids                        Z09, Encounter for follow-up examination after                         completed treatment for conditions other than                         malignant neoplasm  Z86.010, Personal history of colonic polyps CPT copyright 2022 [employer]. All rights reserved. The codes documented in this report are preliminary and upon coder review may  be revised to meet current compliance requirements. Ole Schick MD, MD 02/19/2024 9:35:40 AM Number of Addenda: 0 Note Initiated On: 02/19/2024 8:58 AM Scope Withdrawal Time: 0 hours 13 minutes 37 seconds  Total Procedure Duration: 0 hours 18 minutes 54 seconds  Estimated Blood Loss:  Estimated blood loss was minimal.      [facility]

## 2024-02-20 ENCOUNTER — Encounter: Payer: Self-pay | Admitting: Gastroenterology

## 2024-02-20 NOTE — Anesthesia Postprocedure Evaluation (Signed)
 Anesthesia Post Note  Patient: Susan Casey  Procedure(s) Performed: COLONOSCOPY WITH PROPOFOL  POLYPECTOMY, INTESTINE  Patient location during evaluation: PACU Anesthesia Type: General Level of consciousness: awake and alert Pain management: pain level controlled Vital Signs Assessment: post-procedure vital signs reviewed and stable Respiratory status: spontaneous breathing, nonlabored ventilation, respiratory function stable and patient connected to nasal cannula oxygen Cardiovascular status: blood pressure returned to baseline and stable Postop Assessment: no apparent nausea or vomiting Anesthetic complications: no   No notable events documented.   Last Vitals:  Vitals:   02/19/24 0947 02/19/24 0950  BP: 103/75 131/75  Pulse:  70  Resp: 18 16  Temp:    SpO2:      Last Pain:  Vitals:   02/20/24 0750  TempSrc:   PainSc: 0-No pain                 Lynwood KANDICE Clause

## 2024-02-21 LAB — SURGICAL PATHOLOGY

## 2024-08-12 ENCOUNTER — Encounter: Payer: Self-pay | Admitting: Family Medicine

## 2024-08-12 ENCOUNTER — Other Ambulatory Visit (HOSPITAL_COMMUNITY): Payer: Self-pay

## 2024-08-12 ENCOUNTER — Ambulatory Visit: Admitting: Family Medicine

## 2024-08-12 ENCOUNTER — Telehealth: Payer: Self-pay

## 2024-08-12 VITALS — BP 127/84 | HR 96 | Resp 16 | Wt 214.3 lb

## 2024-08-12 DIAGNOSIS — E66811 Obesity, class 1: Secondary | ICD-10-CM

## 2024-08-12 DIAGNOSIS — E785 Hyperlipidemia, unspecified: Secondary | ICD-10-CM | POA: Diagnosis not present

## 2024-08-12 DIAGNOSIS — D751 Secondary polycythemia: Secondary | ICD-10-CM

## 2024-08-12 DIAGNOSIS — Z1231 Encounter for screening mammogram for malignant neoplasm of breast: Secondary | ICD-10-CM | POA: Diagnosis not present

## 2024-08-12 DIAGNOSIS — E559 Vitamin D deficiency, unspecified: Secondary | ICD-10-CM | POA: Diagnosis not present

## 2024-08-12 DIAGNOSIS — K760 Fatty (change of) liver, not elsewhere classified: Secondary | ICD-10-CM | POA: Diagnosis not present

## 2024-08-12 DIAGNOSIS — Z7985 Long-term (current) use of injectable non-insulin antidiabetic drugs: Secondary | ICD-10-CM | POA: Diagnosis not present

## 2024-08-12 DIAGNOSIS — R5383 Other fatigue: Secondary | ICD-10-CM | POA: Diagnosis not present

## 2024-08-12 DIAGNOSIS — Z6833 Body mass index (BMI) 33.0-33.9, adult: Secondary | ICD-10-CM | POA: Diagnosis not present

## 2024-08-12 DIAGNOSIS — Z23 Encounter for immunization: Secondary | ICD-10-CM | POA: Diagnosis not present

## 2024-08-12 DIAGNOSIS — E1169 Type 2 diabetes mellitus with other specified complication: Secondary | ICD-10-CM | POA: Diagnosis not present

## 2024-08-12 DIAGNOSIS — E538 Deficiency of other specified B group vitamins: Secondary | ICD-10-CM | POA: Diagnosis not present

## 2024-08-12 DIAGNOSIS — E119 Type 2 diabetes mellitus without complications: Secondary | ICD-10-CM | POA: Insufficient documentation

## 2024-08-12 MED ORDER — TIRZEPATIDE 2.5 MG/0.5ML ~~LOC~~ SOAJ: 2.5000 mg | SUBCUTANEOUS | 4 refills | Status: AC

## 2024-08-12 MED ORDER — SIMVASTATIN 20 MG PO TABS: 20.0000 mg | ORAL_TABLET | Freq: Every day | ORAL | 3 refills | Status: DC

## 2024-08-12 NOTE — Assessment & Plan Note (Signed)
 Chronic  Continue simvastatin  20mg  daily  Lipid panel ordered

## 2024-08-12 NOTE — Patient Instructions (Signed)
 To keep you healthy, please keep in mind the following health maintenance items that you are due for:   Health Maintenance Due  Topic Date Due   HEMOGLOBIN A1C  Never done   Medicare Annual Wellness (AWV)  Never done   FOOT EXAM  Never done   OPHTHALMOLOGY EXAM  Never done   Diabetic kidney evaluation - Urine ACR  Never done   Bone Density Scan  Never done   COVID-19 Vaccine (4 - 2025-26 season) 04/07/2024   Diabetic kidney evaluation - eGFR measurement  05/21/2024   Mammogram  07/24/2024     Best Wishes,   Dr. Lang

## 2024-08-12 NOTE — Progress Notes (Signed)
 "  New Patient Office Visit  Patient ID: Susan Casey, Female   DOB: 1950/12/06 74 y.o. MRN: [id number]  Chief Complaint  Patient presents with   Establish Care    New patient establishing care. Previous PCP: Jeffie Craze Pap smear: Hysterectomy Mammogram: 2023 Bone Density: No Colonoscopy: 2 last year-[facility] Dr. Maryruth Immunizations: flu vaccine today and reports that she had 2 shingles vaccine   Fatigue    Per patient previous pcp advised her to take vitamin b12.   Subjective:     Susan Casey presents to establish care  HPI  Discussed the use of AI scribe software for clinical note transcription with the patient, who gave verbal consent to proceed.  History of Present Illness Susan Casey is a 74 year old female who presents with fatigue.  She experiences persistent fatigue, feeling tired upon waking and going to bed. This has been ongoing for a significant period. She attributes some of her fatigue to her weight, which she finds difficult to manage. She reports difficulty managing her weight and desires to lose weight, believing it would improve her energy levels.  She has a history of B12 deficiency and stopped taking vitamin B12 supplements around August, reporting no change in her symptoms. Her last B12 level, checked a year ago, was 760.  She has diet-controlled diabetes, with her last A1c being 6.8 five months ago. She feels frustrated with her previous provider's management of her diabetes.  She has hepatic steatosis and declined a previous ultrasound. Her last metabolic panel three months ago showed hypokalemia with a potassium level of 3.4, elevated glucose at 130, and elevated liver enzymes (AST 47, ALT 70).  She was evaluated by hematology for leukocytosis and erythrocytosis in November 2024, with normal EPO and negative JAK2 mutation. Hematology found no cancer markers after extensive blood work. She has a history of elevated white blood cells at  15, elevated red blood cells at 5.2, and elevated hemoglobin at 16.1. She also had an elevated kappa light chain of 1.66.  She reports a sedentary lifestyle, attributing it to her lack of energy and fatigue. She does not exercise and describes herself as 'miserable' due to her weight and energy levels. She does not smoke or drink alcohol.  Family history includes high blood pressure in her mother, who was on medication for 30 years, and both parents died in their late 46s without cancer.   Outpatient Encounter Medications as of 08/12/2024  Medication Sig   tirzepatide  (MOUNJARO ) 2.5 MG/0.5ML Pen Inject 2.5 mg into the skin once a week.   [DISCONTINUED] simvastatin  (ZOCOR ) 20 MG tablet Take 1 tablet by mouth at bedtime.   simvastatin  (ZOCOR ) 20 MG tablet Take 1 tablet (20 mg total) by mouth at bedtime.   [DISCONTINUED] Cholecalciferol (D 1000) 25 MCG (1000 UT) capsule Take 1,000 Units by mouth daily.   [DISCONTINUED] cyanocobalamin  (VITAMIN B12) 1000 MCG tablet Take 1,000 mcg by mouth daily.   [DISCONTINUED] Omega-3 1000 MG CAPS Take 1 capsule by mouth 2 (two) times daily.   No facility-administered encounter medications on file as of 08/12/2024.    Past Medical History:  Diagnosis Date   Anxiety    Fatty liver    Hemorrhoids    Hyperlipidemia    Impaired glucose tolerance    Menopause     Past Surgical History:  Procedure Laterality Date   ABDOMINAL HYSTERECTOMY     COLONOSCOPY WITH PROPOFOL  N/A 08/14/2023   Procedure: COLONOSCOPY WITH PROPOFOL ;  Surgeon: Maryruth Ole DASEN, MD;  Location: [facility] ENDOSCOPY;  Service: Endoscopy;  Laterality: N/A;   COLONOSCOPY WITH PROPOFOL  N/A 02/19/2024   Procedure: COLONOSCOPY WITH PROPOFOL ;  Surgeon: Maryruth Ole DASEN, MD;  Location: [facility] ENDOSCOPY;  Service: Endoscopy;  Laterality: N/A;   laparascopic esophagogastric nissen funcoplastly single port     POLYPECTOMY  02/19/2024   Procedure: POLYPECTOMY, INTESTINE;  Surgeon: Maryruth Ole DASEN, MD;   Location: [facility] ENDOSCOPY;  Service: Endoscopy;;   TONSILLECTOMY     TUBAL LIGATION      Family History  Problem Relation Age of Onset   Breast cancer Neg Hx     Social History   Socioeconomic History   Marital status: Married    Spouse name: Not on file   Number of children: Not on file   Years of education: Not on file   Highest education level: Not on file  Occupational History   Not on file  Tobacco Use   Smoking status: Former    Types: Cigarettes   Smokeless tobacco: Former  Advertising Account Planner   Vaping status: Never Used  Substance and Sexual Activity   Alcohol use: Not Currently   Drug use: Never   Sexual activity: Not on file  Other Topics Concern   Not on file  Social History Narrative   Not on file   Social Drivers of Health   Tobacco Use: Medium Risk (08/12/2024)   Patient History    Smoking Tobacco Use: Former    Smokeless Tobacco Use: Former    Passive Exposure: Not on Actuary Strain: Low Risk (08/12/2024)   Overall Financial Resource Strain (CARDIA)    Difficulty of Paying Living Expenses: Not hard at all  Food Insecurity: No Food Insecurity (08/12/2024)   Epic    Worried About Programme Researcher, Broadcasting/film/video in the Last Year: Never true    Ran Out of Food in the Last Year: Never true  Transportation Needs: No Transportation Needs (09/04/2023)   Received from [facility]   PRAPARE - Transportation    In the past 12 months, has lack of transportation kept you from medical appointments or from getting medications?: No    Lack of Transportation (Non-Medical): No  Physical Activity: Inactive (08/12/2024)   Exercise Vital Sign    Days of Exercise per Week: 0 days    Minutes of Exercise per Session: 0 min  Stress: No Stress Concern Present (08/12/2024)   [employer] of Occupational Health - Occupational Stress Questionnaire    Feeling of Stress: Only a little  Social Connections: Moderately Isolated (08/12/2024)   Social Connection and  Isolation Panel    Frequency of Communication with Friends and Family: More than three times a week    Frequency of Social Gatherings with Friends and Family: More than three times a week    Attends Religious Services: Never    Database Administrator or Organizations: No    Attends Banker Meetings: Never    Marital Status: Married  Catering Manager Violence: Not At Risk (08/12/2024)   Epic    Fear of Current or Ex-Partner: No    Emotionally Abused: No    Physically Abused: No    Sexually Abused: No  Depression (PHQ2-9): Medium Risk (08/12/2024)   Depression (PHQ2-9)    PHQ-2 Score: 5  Alcohol Screen: Low Risk (08/12/2024)   Alcohol Screen    Last Alcohol Screening Score (AUDIT): 0  Housing: Low Risk (08/12/2024)   Epic  Unable to Pay for Housing in the Last Year: No    Number of Times Moved in the Last Year: 0    Homeless in the Last Year: No  Utilities: Not At Risk (08/12/2024)   Epic    Threatened with loss of utilities: No  Health Literacy: Adequate Health Literacy (08/12/2024)   B1300 Health Literacy    Frequency of need for help with medical instructions: Never    ROS    Objective:    BP 127/84 (BP Location: Left Arm, Patient Position: Sitting, Cuff Size: Large)   Pulse 96   Resp 16   Wt 214 lb 4.8 oz (97.2 kg)   SpO2 94%   BMI 33.56 kg/m   16.25in neck circumference   Physical Exam Vitals reviewed.  Constitutional:      General: She is not in acute distress.    Appearance: Normal appearance. She is not ill-appearing.  Neck:     Thyroid : No thyroid  mass, thyromegaly or thyroid  tenderness.  Cardiovascular:     Rate and Rhythm: Normal rate and regular rhythm.  Pulmonary:     Effort: Pulmonary effort is normal. No respiratory distress.     Breath sounds: No wheezing, rhonchi or rales.  Musculoskeletal:     Right lower leg: No edema.     Left lower leg: No edema.  Lymphadenopathy:     Cervical: No cervical adenopathy.  Neurological:     Mental  Status: She is alert and oriented to person, place, and time.  Psychiatric:        Mood and Affect: Mood normal.        Behavior: Behavior normal.      Last CBC Lab Results  Component Value Date   WBC 15.8 (H) 05/22/2023   HGB 16.1 (H) 05/22/2023   HCT 47.1 (H) 05/22/2023   MCV 89.5 05/22/2023   MCH 30.6 05/22/2023   RDW 13.2 05/22/2023   PLT 275 05/22/2023   Last metabolic panel Lab Results  Component Value Date   GLUCOSE 130 (H) 05/22/2023   NA 135 05/22/2023   K 3.4 (L) 05/22/2023   CL 104 05/22/2023   CO2 21 (L) 05/22/2023   BUN 15 05/22/2023   CREATININE 0.63 05/22/2023   GFRNONAA >60 05/22/2023   CALCIUM  9.5 05/22/2023   PROT 8.1 05/22/2023   ALBUMIN 4.4 05/22/2023   LABGLOB 3.6 05/22/2023   BILITOT 0.4 05/22/2023   ALKPHOS 73 05/22/2023   AST 47 (H) 05/22/2023   ALT 70 (H) 05/22/2023   ANIONGAP 10 05/22/2023   Last lipids No results found for: CHOL, HDL, LDLCALC, LDLDIRECT, TRIG, CHOLHDL Last hemoglobin A1c No results found for: HGBA1C Last thyroid  functions Lab Results  Component Value Date   TSH 1.555 05/22/2023   Last vitamin D  No results found for: 25OHVITD2, 25OHVITD3, VD25OH Last vitamin B12 and Folate Lab Results  Component Value Date   VITAMINB12 760 05/22/2023        Assessment & Plan:   Problem List Items Addressed This Visit     Class 1 obesity with serious comorbidity and body mass index (BMI) of 33.0 to 33.9 in adult    Obesity, class 1 chronic Class 1 obesity with BMI of 33. Discussed challenges with weight loss and potential benefits of Mounjaro  for weight loss and blood sugar control. Open to medication-assisted weight loss. - Prescribed Mounjaro  2.5 mg for weight loss, pending insurance approval      Relevant Medications   tirzepatide  (MOUNJARO ) 2.5 MG/0.5ML Pen  Controlled type 2 diabetes mellitus without complication, without long-term current use of insulin (HCC)   Type 2 diabetes  mellitus chronic Diagnosed with type 2 diabetes mellitus with last A1c of 6.8% five months ago. Managed with diet control. Discussed potential benefits of Mounjaro  for blood sugar control and weight loss. - Ordered A1c test to monitor diabetes control - Prescribed Mounjaro  2.5 mg for blood sugar control and weight loss, pending insurance approval      Relevant Medications   simvastatin  (ZOCOR ) 20 MG tablet   tirzepatide  (MOUNJARO ) 2.5 MG/0.5ML Pen   Other Relevant Orders   Hemoglobin A1c   Erythrocytosis (Chronic)   Erythrocytosis chronic Previous hematology evaluation showed elevated hemoglobin and white blood cell count. No malignancy detected. Recommended weight loss as a management strategy. - Continue weight management strategies Cbc ordered today      Relevant Orders   CBC w/Diff/Platelet   Hepatic steatosis   Hepatic steatosis chronic Previous ultrasound. Elevated liver enzymes possibly related to fatty liver. Declined further ultrasound testing. - Ordered complete metabolic panel to assess liver function      Relevant Orders   CMP14+EGFR   Hyperlipidemia associated with type 2 diabetes mellitus (HCC)   Chronic  Continue simvastatin  20mg  daily  Lipid panel ordered       Relevant Medications   simvastatin  (ZOCOR ) 20 MG tablet   tirzepatide  (MOUNJARO ) 2.5 MG/0.5ML Pen   Other Relevant Orders   Lipid panel   Other fatigue - Primary   Fatigue Chronic fatigue with no clear etiology. Previous hematology evaluation for elevated blood counts showed no malignancy. Possible contributing factors include obesity, diabetes, and potential sleep apnea. Declined previous sleep study but open to home sleep study. Discussed potential benefits of Mounjaro  for weight loss and blood sugar control, which may also improve fatigue. - Ordered home sleep study to evaluate for obstructive sleep apnea - Ordered blood tests including vitamin D , B12, TSH, T4, A1c, complete metabolic panel, and  lipid panel - Prescribed Mounjaro  2.5 mg for weight loss and blood sugar control, pending insurance approval      Relevant Orders   VITAMIN D  25 Hydroxy (Vit-D Deficiency, Fractures)   Vitamin B12   TSH + free T4   CMP14+EGFR   CBC w/Diff/Platelet   Home sleep test   Vitamin B12 deficiency    Vitamin B12 deficiency Chronic  Previous B12 deficiency treated with supplements. - Ordered vitamin B12 level test - Recommended age-appropriate multivitamin      Relevant Orders   Vitamin B12   Vitamin D  deficiency    Vitamin D  deficiency Chronic  Potential vitamin D  deficiency contributing to fatigue. - Ordered vitamin D  level test - Recommended age-appropriate multivitamin      Relevant Orders   VITAMIN D  25 Hydroxy (Vit-D Deficiency, Fractures)   Other Visit Diagnoses       Immunization due       Relevant Orders   Flu vaccine HIGH DOSE PF(Fluzone Trivalent) (Completed)     Encounter for screening mammogram for malignant neoplasm of breast       Relevant Orders   MM 3D SCREENING MAMMOGRAM BILATERAL BREAST       Assessment and Plan Assessment & Plan         General Health Maintenance Declined bone density scan. Last mammogram in 2023. Declined further colonoscopy. Discussed importance of regular eye exams for diabetes management. - Ordered mammogram - Recommended annual eye exam with ophthalmologist for diabetes management    Return in about 3  months (around 11/10/2024) for fatigue, DM.   Rockie Agent, MD [facility]     "

## 2024-08-12 NOTE — Telephone Encounter (Signed)
 Pharmacy Patient Advocate Encounter   Received notification from Physician's Office that prior authorization for Mounjaro  2.5MG /0.5ML auto-injectors  is required/requested.   Insurance verification completed.   The patient is insured through [employer] 2017 NCPDP.   Per test claim: PA required; PA started via CoverMyMeds. KEY [id number] . Waiting for clinical questions to populate.

## 2024-08-12 NOTE — Assessment & Plan Note (Signed)
 Fatigue Chronic fatigue with no clear etiology. Previous hematology evaluation for elevated blood counts showed no malignancy. Possible contributing factors include obesity, diabetes, and potential sleep apnea. Declined previous sleep study but open to home sleep study. Discussed potential benefits of Mounjaro  for weight loss and blood sugar control, which may also improve fatigue. - Ordered home sleep study to evaluate for obstructive sleep apnea - Ordered blood tests including vitamin D , B12, TSH, T4, A1c, complete metabolic panel, and lipid panel - Prescribed Mounjaro  2.5 mg for weight loss and blood sugar control, pending insurance approval

## 2024-08-12 NOTE — Assessment & Plan Note (Signed)
 Type 2 diabetes mellitus chronic Diagnosed with type 2 diabetes mellitus with last A1c of 6.8% five months ago. Managed with diet control. Discussed potential benefits of Mounjaro  for blood sugar control and weight loss. - Ordered A1c test to monitor diabetes control - Prescribed Mounjaro  2.5 mg for blood sugar control and weight loss, pending insurance approval

## 2024-08-12 NOTE — Assessment & Plan Note (Signed)
 Hepatic steatosis chronic Previous ultrasound. Elevated liver enzymes possibly related to fatty liver. Declined further ultrasound testing. - Ordered complete metabolic panel to assess liver function

## 2024-08-12 NOTE — Assessment & Plan Note (Signed)
" °  Obesity, class 1 chronic Class 1 obesity with BMI of 33. Discussed challenges with weight loss and potential benefits of Mounjaro  for weight loss and blood sugar control. Open to medication-assisted weight loss. - Prescribed Mounjaro  2.5 mg for weight loss, pending insurance approval "

## 2024-08-12 NOTE — Assessment & Plan Note (Signed)
" °  Vitamin D  deficiency Chronic  Potential vitamin D  deficiency contributing to fatigue. - Ordered vitamin D  level test - Recommended age-appropriate multivitamin "

## 2024-08-12 NOTE — Assessment & Plan Note (Signed)
 Erythrocytosis chronic Previous hematology evaluation showed elevated hemoglobin and white blood cell count. No malignancy detected. Recommended weight loss as a management strategy. - Continue weight management strategies Cbc ordered today

## 2024-08-12 NOTE — Assessment & Plan Note (Signed)
" °  Vitamin B12 deficiency Chronic  Previous B12 deficiency treated with supplements. - Ordered vitamin B12 level test - Recommended age-appropriate multivitamin "

## 2024-08-13 ENCOUNTER — Ambulatory Visit: Payer: Self-pay | Admitting: Family Medicine

## 2024-08-13 ENCOUNTER — Ambulatory Visit
Admission: RE | Admit: 2024-08-13 | Discharge: 2024-08-13 | Disposition: A | Discharge status: Home / Self Care | Source: Ambulatory Visit | Attending: Family Medicine | Admitting: Family Medicine

## 2024-08-13 ENCOUNTER — Other Ambulatory Visit (HOSPITAL_COMMUNITY): Payer: Self-pay

## 2024-08-13 DIAGNOSIS — Z1231 Encounter for screening mammogram for malignant neoplasm of breast: Secondary | ICD-10-CM | POA: Insufficient documentation

## 2024-08-13 DIAGNOSIS — E1169 Type 2 diabetes mellitus with other specified complication: Secondary | ICD-10-CM

## 2024-08-13 LAB — LIPID PANEL
Chol/HDL Ratio: 4.1 ratio (ref 0.0–4.4)
Cholesterol, Total: 209 mg/dL — ABNORMAL HIGH (ref 100–199)
HDL: 51 mg/dL
LDL Chol Calc (NIH): 128 mg/dL — ABNORMAL HIGH (ref 0–99)
Triglycerides: 167 mg/dL — ABNORMAL HIGH (ref 0–149)
VLDL Cholesterol Cal: 30 mg/dL (ref 5–40)

## 2024-08-13 LAB — TSH+FREE T4
Free T4: 1.18 ng/dL (ref 0.82–1.77)
TSH: 2.14 u[IU]/mL (ref 0.450–4.500)

## 2024-08-13 LAB — CMP14+EGFR
ALT: 58 IU/L — ABNORMAL HIGH (ref 0–32)
AST: 47 IU/L — ABNORMAL HIGH (ref 0–40)
Albumin: 4.6 g/dL (ref 3.8–4.8)
Alkaline Phosphatase: 104 IU/L (ref 49–135)
BUN/Creatinine Ratio: 26 (ref 12–28)
BUN: 15 mg/dL (ref 8–27)
Bilirubin Total: 0.4 mg/dL (ref 0.0–1.2)
CO2: 24 mmol/L (ref 20–29)
Calcium: 9.9 mg/dL (ref 8.7–10.3)
Chloride: 98 mmol/L (ref 96–106)
Creatinine, Ser: 0.58 mg/dL (ref 0.57–1.00)
Globulin, Total: 3.1 g/dL (ref 1.5–4.5)
Glucose: 183 mg/dL — ABNORMAL HIGH (ref 70–99)
Potassium: 4.9 mmol/L (ref 3.5–5.2)
Sodium: 136 mmol/L (ref 134–144)
Total Protein: 7.7 g/dL (ref 6.0–8.5)
eGFR: 95 mL/min/1.73

## 2024-08-13 LAB — CBC WITH DIFFERENTIAL/PLATELET
Basophils Absolute: 0.1 x10E3/uL (ref 0.0–0.2)
Basos: 1 %
EOS (ABSOLUTE): 0.3 x10E3/uL (ref 0.0–0.4)
Eos: 2 %
Hematocrit: 50.3 % — ABNORMAL HIGH (ref 34.0–46.6)
Hemoglobin: 16.4 g/dL — ABNORMAL HIGH (ref 11.1–15.9)
Immature Grans (Abs): 0 x10E3/uL (ref 0.0–0.1)
Immature Granulocytes: 0 %
Lymphocytes Absolute: 5.9 x10E3/uL — ABNORMAL HIGH (ref 0.7–3.1)
Lymphs: 42 %
MCH: 29.8 pg (ref 26.6–33.0)
MCHC: 32.6 g/dL (ref 31.5–35.7)
MCV: 92 fL (ref 79–97)
Monocytes Absolute: 0.8 x10E3/uL (ref 0.1–0.9)
Monocytes: 6 %
Neutrophils Absolute: 6.8 x10E3/uL (ref 1.4–7.0)
Neutrophils: 49 %
Platelets: 330 x10E3/uL (ref 150–450)
RBC: 5.5 x10E6/uL — ABNORMAL HIGH (ref 3.77–5.28)
RDW: 12.8 % (ref 11.7–15.4)
WBC: 14.1 x10E3/uL — ABNORMAL HIGH (ref 3.4–10.8)

## 2024-08-13 LAB — HEMOGLOBIN A1C
Est. average glucose Bld gHb Est-mCnc: 163 mg/dL
Hgb A1c MFr Bld: 7.3 % — ABNORMAL HIGH (ref 4.8–5.6)

## 2024-08-13 LAB — VITAMIN D 25 HYDROXY (VIT D DEFICIENCY, FRACTURES): Vit D, 25-Hydroxy: 27.4 ng/mL — ABNORMAL LOW (ref 30.0–100.0)

## 2024-08-13 LAB — VITAMIN B12: Vitamin B-12: 624 pg/mL (ref 232–1245)

## 2024-08-13 MED ORDER — ROSUVASTATIN CALCIUM 20 MG PO TABS: 20.0000 mg | ORAL_TABLET | Freq: Every day | ORAL | 3 refills | Status: AC

## 2024-08-13 NOTE — Telephone Encounter (Signed)
 Pharmacy Patient Advocate Encounter  Received notification from [employer] 2017 NCPDP that Prior Authorization for Mounjaro  2.5MG /0.5ML auto-injectors  has been APPROVED from 08/12/2024 to 08/06/2025   PA #/Case ID/Reference #: [id number]

## 2024-08-13 NOTE — Telephone Encounter (Signed)
 Spoke with patient regarding approval, patient understood and will contact pharmacy to fill this.  Patient states you discussed with her the option of doing a sleep study if insurance did not approve mounjaro - wants to know if she should continue with this?

## 2024-08-14 NOTE — Telephone Encounter (Signed)
 Spoke with patient and advised of Dr. Feliberto recommendation. Patient agreeable and will contact us  if she does not hear anything regarding this

## 2024-08-14 NOTE — Telephone Encounter (Signed)
 Yes, she should proceed with sleep study as a part of the fatigue work up to rule out OSA

## 2024-09-03 ENCOUNTER — Ambulatory Visit

## 2024-09-03 DIAGNOSIS — Z0000 Encounter for general adult medical examination without abnormal findings: Secondary | ICD-10-CM | POA: Diagnosis not present

## 2024-09-03 NOTE — Patient Instructions (Addendum)
 Susan Casey,  Thank you for taking the time for your Medicare Wellness Visit. I appreciate your continued commitment to your health goals. Please review the care plan we discussed, and feel free to reach out if I can assist you further.  Please note that Annual Wellness Visits do not include a physical exam. Some assessments may be limited, especially if the visit was conducted virtually. If needed, we may recommend an in-person follow-up with your provider.  Ongoing Care Seeing your primary care provider every 3 to 6 months helps us  monitor your health and provide consistent, personalized care. 11/10/24 @ 10:20 AM APPT W/ DR.[email or web address]-ROBINSON  Referrals If a referral was made during today's visit and you haven't received any updates within two weeks, please contact the referred provider directly to check on the status.  Recommended Screenings:  Health Maintenance  Topic Date Due   Medicare Annual Wellness Visit  Never done   Complete foot exam   Never done   Eye exam for diabetics  Never done   Kidney health urinalysis for diabetes  Never done   Osteoporosis screening with Bone Density Scan  Never done   COVID-19 Vaccine (4 - 2025-26 season) 04/07/2024   Hemoglobin A1C  02/09/2025   Yearly kidney function blood test for diabetes  08/12/2025   Breast Cancer Screening  08/13/2025   DTaP/Tdap/Td vaccine (2 - Td or Tdap) 12/18/2032   Colon Cancer Screening  02/18/2034   Pneumococcal Vaccine for age over 25  Completed   Flu Shot  Completed   Hepatitis C Screening  Completed   Zoster (Shingles) Vaccine  Completed   Meningitis B Vaccine  Aged Out     Vision: Annual vision screenings are recommended for early detection of glaucoma, cataracts, and diabetic retinopathy. These exams can also reveal signs of chronic conditions such as diabetes and high blood pressure.  Dental: Annual dental screenings help detect early signs of oral cancer, gum disease, and other conditions linked to overall  health, including heart disease and diabetes.  Please see the attached documents for additional preventive care recommendations.   NEXT AWV 09/09/25 @ 3:50 PM IN PERSON

## 2024-09-03 NOTE — Progress Notes (Signed)
 "  Chief Complaint  Patient presents with   Medicare Wellness     Subjective:   Susan Casey is a 74 y.o. female who presents for a Medicare Annual Wellness Visit.  Visit info / Clinical Intake: Medicare Wellness Visit Type:: Subsequent Annual Wellness Visit Persons participating in visit and providing information:: patient Medicare Wellness Visit Mode:: Telephone If telephone:: video declined Since this visit was completed virtually, some vitals may be partially provided or unavailable. Missing vitals are due to the limitations of the virtual format.: Unable to obtain vitals - no equipment If Telephone or Video please confirm:: I connected with patient using audio/video enable telemedicine. I verified patient identity with two identifiers, discussed telehealth limitations, and patient agreed to proceed. Patient Location:: HOME Provider Location:: HOME OFFICE Interpreter Needed?: No Pre-visit prep was completed: yes AWV questionnaire completed by patient prior to visit?: no Living arrangements:: lives with spouse/significant other Patient's Overall Health Status Rating: good Typical amount of pain: none Does pain affect daily life?: no Are you currently prescribed opioids?: no  Dietary Habits and Nutritional Risks How many meals a day?: 3 Eats fruit and vegetables daily?: yes Most meals are obtained by: preparing own meals In the last 2 weeks, have you had any of the following?: none Diabetic:: (!) yes Any non-healing wounds?: no How often do you check your BS?: 0 Would you like to be referred to a Nutritionist or for Diabetic Management? : no  Functional Status Activities of Daily Living (to include ambulation/medication): Independent Ambulation: Independent Medication Administration: Independent Home Management (perform basic housework or laundry): Independent Manage your own finances?: yes Primary transportation is: driving Concerns about vision?: no *vision  screening is required for WTM* (GLASSES FOR DRIVING- PATTY VISION) Concerns about hearing?: no  Fall Screening Falls in the past year?: 0 Number of falls in past year: 0 Was there an injury with Fall?: 0 Fall Risk Category Calculator: 0 Patient Fall Risk Level: Low Fall Risk  Fall Risk Patient at Risk for Falls Due to: No Fall Risks Fall risk Follow up: Falls evaluation completed; Falls prevention discussed  Home and Transportation Safety: All rugs have non-skid backing?: yes All stairs or steps have railings?: yes Grab bars in the bathtub or shower?: yes Have non-skid surface in bathtub or shower?: yes Good home lighting?: yes Regular seat belt use?: yes Hospital stays in the last year:: no  Cognitive Assessment Difficulty concentrating, remembering, or making decisions? : no Will 6CIT or Mini Cog be Completed: yes What year is it?: 0 points What month is it?: 0 points Give patient an address phrase to remember (5 components): [address] [place], [place] About what time is it?: 0 points Count backwards from 20 to 1: 0 points Say the months of the year in reverse: 0 points Repeat the address phrase from earlier: 0 points 6 CIT Score: 0 points  Advance Directives (For Healthcare) Does Patient Have a Medical Advance Directive?: No Type of Advance Directive: Living will Would patient like information on creating a medical advance directive?: No - Patient declined  Reviewed/Updated  Reviewed/Updated: Reviewed All (Medical, Surgical, Family, Medications, Allergies, Care Teams, Patient Goals)    Allergies (verified) Wound dressing adhesive   Current Medications (verified) Outpatient Encounter Medications as of 09/03/2024  Medication Sig   cholecalciferol (VITAMIN D3) 25 MCG (1000 UNIT) tablet Take 2,000 Units by mouth daily.   Multiple Vitamin (MULTIVITAMIN) tablet Take 1 tablet by mouth daily.   rosuvastatin  (CRESTOR ) 20 MG tablet Take  1 tablet (20 mg total) by mouth  daily.   tirzepatide  (MOUNJARO ) 2.5 MG/0.5ML Pen Inject 2.5 mg into the skin once a week.   No facility-administered encounter medications on file as of 09/03/2024.    History: Past Medical History:  Diagnosis Date   Anxiety    Fatty liver    Hemorrhoids    Hyperlipidemia    Impaired glucose tolerance    Menopause    Past Surgical History:  Procedure Laterality Date   ABDOMINAL HYSTERECTOMY     COLONOSCOPY WITH PROPOFOL  N/A 08/14/2023   Procedure: COLONOSCOPY WITH PROPOFOL ;  Surgeon: Maryruth Ole DASEN, MD;  Location: [facility] END[facility]COPY;  Service: Endoscopy;  Laterality: N/A;   COLONOSCOPY WITH PROPOFOL  N/A 02/19/2024   Procedure: COLONOSCOPY WITH PROPOFOL ;  Surgeon: Maryruth Ole DASEN, MD;  Location: [facility] ENDOSCOPY;  Service: Endoscopy;  Laterality: N/A;   laparascopic esophagogastric nissen funcoplastly single port     POLYPECTOMY  02/19/2024   Procedure: POLYPECTOMY, INTESTINE;  Surgeon: Maryruth Ole DASEN, MD;  Location: [facility] ENDOSCOPY;  Service: Endoscopy;;   TONSILLECTOMY     TUBAL LIGATION     Family History  Problem Relation Age of Onset   Breast cancer Neg Hx    Social History   Occupational History   Not on file  Tobacco Use   Smoking status: Former    Types: Cigarettes   Smokeless tobacco: Former  Building Services Engineer status: Never Used  Substance and Sexual Activity   Alcohol use: Not Currently   Drug use: Never   Sexual activity: Not on file   Tobacco Counseling Counseling given: Not Answered  SDOH Screenings   Food Insecurity: No Food Insecurity (09/03/2024)  Housing: Unknown (09/03/2024)  Transportation Needs: No Transportation Needs (09/03/2024)  Utilities: Not At Risk (09/03/2024)  Alcohol Screen: Low Risk (08/12/2024)  Depression (PHQ2-9): Low Risk (09/03/2024)  Recent Concern: Depression (PHQ2-9) - Medium Risk (08/12/2024)  Financial Resource Strain: Low Risk (08/12/2024)  Physical Activity: Insufficiently Active (09/03/2024)  Social Connections:  Moderately Isolated (09/03/2024)  Stress: No Stress Concern Present (09/03/2024)  Tobacco Use: Medium Risk (09/03/2024)  Health Literacy: Adequate Health Literacy (09/03/2024)   See flowsheets for full screening details  Depression Screen PHQ 2 & 9 Depression Scale- Over the past 2 weeks, how often have you been bothered by any of the following problems? Little interest or pleasure in doing things: 0 Feeling down, depressed, or hopeless (PHQ Adolescent also includes...irritable): 0 PHQ-2 Total Score: 0 Trouble falling or staying asleep, or sleeping too much: 0 Feeling tired or having little energy: 0 Poor appetite or overeating (PHQ Adolescent also includes...weight loss): 0 Feeling bad about yourself - or that you are a failure or have let yourself or your family down: 0 Trouble concentrating on things, such as reading the newspaper or watching television (PHQ Adolescent also includes...like school work): 0 Moving or speaking so slowly that other people could have noticed. Or the opposite - being so fidgety or restless that you have been moving around a lot more than usual: 0 Thoughts that you would be better off dead, or of hurting yourself in some way: 0 PHQ-9 Total Score: 0 If you checked off any problems, how difficult have these problems made it for you to do your work, take care of things at home, or get along with other people?: Not difficult at all     Goals Addressed             This Visit's Progress  DIET - EAT MORE FRUITS AND VEGETABLES               Objective:    There were no vitals filed for this visit. There is no height or weight on file to calculate BMI.  Hearing/Vision screen No results found. Immunizations and Health Maintenance Health Maintenance  Topic Date Due   FOOT EXAM  Never done   OPHTHALMOLOGY EXAM  Never done   Diabetic kidney evaluation - Urine ACR  Never done   Bone Density Scan  Never done   COVID-19 Vaccine (4 - 2025-26 season)  04/07/2024   HEMOGLOBIN A1C  02/09/2025   Diabetic kidney evaluation - eGFR measurement  08/12/2025   Mammogram  08/13/2025   Medicare Annual Wellness (AWV)  09/03/2025   DTaP/Tdap/Td (2 - Td or Tdap) 12/18/2032   Colonoscopy  02/18/2034   Pneumococcal Vaccine: 50+ Years  Completed   Influenza Vaccine  Completed   Hepatitis C Screening  Completed   Zoster Vaccines- Shingrix  Completed   Meningococcal B Vaccine  Aged Out        Assessment/Plan:  This is a routine wellness examination for Larsen.  Patient Care Team: Sharma Coyer, MD as PCP - General (Family Medicine) Babara Call, MD as Consulting Physician (Oncology) Pa, [facility]  I have personally reviewed and noted the following in the patients chart:   Medical and social history Use of alcohol, tobacco or illicit drugs  Current medications and supplements including opioid prescriptions. Functional ability and status Nutritional status Physical activity Advanced directives List of other physicians Hospitalizations, surgeries, and ER visits in previous 12 months Vitals Screenings to include cognitive, depression, and falls Referrals and appointments  No orders of the defined types were placed in this encounter.  In addition, I have reviewed and discussed with patient certain preventive protocols, quality metrics, and best practice recommendations. A written personalized care plan for preventive services as well as general preventive health recommendations were provided to patient.   Jhonnie GORMAN Das, LPN   [id number]/71/7973   Return in about 1 year (around 09/03/2025).  After Visit Summary: (MyChart) Due to this being a telephonic visit, the after visit summary with patients personalized plan was offered to patient via MyChart   Nurse Notes: UTD ON SHOTS; UTD ON MAMMOGRAM & COLONOSCOPY; DECLINES BDS  No voiced or noted concerns at this time  "

## 2024-11-10 ENCOUNTER — Ambulatory Visit: Admitting: Family Medicine

## 2025-09-09 ENCOUNTER — Ambulatory Visit
